# Patient Record
Sex: Female | Born: 1965
Health system: Southern US, Community
[De-identification: ages and names within clinical notes are randomized; demographics above are authoritative.]

## PROBLEM LIST (undated history)

## (undated) DIAGNOSIS — E041 Nontoxic single thyroid nodule: Secondary | ICD-10-CM

## (undated) DIAGNOSIS — D649 Anemia, unspecified: Secondary | ICD-10-CM

## (undated) DIAGNOSIS — R87619 Unspecified abnormal cytological findings in specimens from cervix uteri: Secondary | ICD-10-CM

## (undated) DIAGNOSIS — K602 Anal fissure, unspecified: Secondary | ICD-10-CM

## (undated) DIAGNOSIS — IMO0002 Reserved for concepts with insufficient information to code with codable children: Secondary | ICD-10-CM

## (undated) DIAGNOSIS — K219 Gastro-esophageal reflux disease without esophagitis: Secondary | ICD-10-CM

## (undated) DIAGNOSIS — K829 Disease of gallbladder, unspecified: Secondary | ICD-10-CM

## (undated) DIAGNOSIS — C439 Malignant melanoma of skin, unspecified: Secondary | ICD-10-CM

## (undated) DIAGNOSIS — Z5189 Encounter for other specified aftercare: Secondary | ICD-10-CM

## (undated) HISTORY — DX: Nontoxic single thyroid nodule: E04.1

## (undated) HISTORY — DX: Unspecified abnormal cytological findings in specimens from cervix uteri: R87.619

## (undated) HISTORY — DX: Anemia, unspecified: D64.9

## (undated) HISTORY — DX: Malignant melanoma of skin, unspecified: C43.9

## (undated) HISTORY — DX: Disease of gallbladder, unspecified: K82.9

## (undated) HISTORY — DX: Gastro-esophageal reflux disease without esophagitis: K21.9

## (undated) HISTORY — DX: Encounter for other specified aftercare: Z51.89

## (undated) HISTORY — DX: Reserved for concepts with insufficient information to code with codable children: IMO0002

## (undated) HISTORY — DX: Anal fissure, unspecified: K60.2

---
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
## 1990-03-25 HISTORY — PX: NOSE SURGERY: SHX723

## 1997-08-10 ENCOUNTER — Other Ambulatory Visit: Admission: RE | Admit: 1997-08-10 | Discharge: 1997-08-10 | Payer: Self-pay | Admitting: Obstetrics and Gynecology

## 1997-09-07 ENCOUNTER — Other Ambulatory Visit: Admission: RE | Admit: 1997-09-07 | Discharge: 1997-09-07 | Payer: Self-pay | Admitting: Obstetrics and Gynecology

## 1997-10-06 ENCOUNTER — Other Ambulatory Visit: Admission: RE | Admit: 1997-10-06 | Discharge: 1997-10-06 | Payer: Self-pay | Admitting: Obstetrics and Gynecology

## 1998-02-21 ENCOUNTER — Other Ambulatory Visit: Admission: RE | Admit: 1998-02-21 | Discharge: 1998-02-21 | Payer: Self-pay | Admitting: Obstetrics and Gynecology

## 1998-05-19 ENCOUNTER — Other Ambulatory Visit: Admission: RE | Admit: 1998-05-19 | Discharge: 1998-05-19 | Payer: Self-pay | Admitting: Obstetrics and Gynecology

## 1998-09-20 ENCOUNTER — Other Ambulatory Visit: Admission: RE | Admit: 1998-09-20 | Discharge: 1998-09-20 | Payer: Self-pay | Admitting: Obstetrics and Gynecology

## 1998-12-14 ENCOUNTER — Other Ambulatory Visit: Admission: RE | Admit: 1998-12-14 | Discharge: 1998-12-14 | Payer: Self-pay | Admitting: Obstetrics and Gynecology

## 1999-04-05 ENCOUNTER — Other Ambulatory Visit: Admission: RE | Admit: 1999-04-05 | Discharge: 1999-04-05 | Payer: Self-pay | Admitting: *Deleted

## 1999-07-23 ENCOUNTER — Other Ambulatory Visit: Admission: RE | Admit: 1999-07-23 | Discharge: 1999-07-23 | Payer: Self-pay | Admitting: Obstetrics and Gynecology

## 1999-09-03 ENCOUNTER — Encounter: Payer: Self-pay | Admitting: Internal Medicine

## 1999-09-03 ENCOUNTER — Ambulatory Visit (HOSPITAL_COMMUNITY): Admission: RE | Admit: 1999-09-03 | Discharge: 1999-09-03 | Payer: Self-pay | Admitting: Internal Medicine

## 1999-12-18 ENCOUNTER — Other Ambulatory Visit: Admission: RE | Admit: 1999-12-18 | Discharge: 1999-12-18 | Payer: Self-pay | Admitting: Obstetrics and Gynecology

## 2000-03-25 DIAGNOSIS — C439 Malignant melanoma of skin, unspecified: Secondary | ICD-10-CM

## 2000-03-25 HISTORY — DX: Malignant melanoma of skin, unspecified: C43.9

## 2001-01-20 ENCOUNTER — Other Ambulatory Visit: Admission: RE | Admit: 2001-01-20 | Discharge: 2001-01-20 | Payer: Self-pay | Admitting: Obstetrics and Gynecology

## 2002-02-25 ENCOUNTER — Other Ambulatory Visit: Admission: RE | Admit: 2002-02-25 | Discharge: 2002-02-25 | Payer: Self-pay | Admitting: Obstetrics and Gynecology

## 2003-04-28 ENCOUNTER — Other Ambulatory Visit: Admission: RE | Admit: 2003-04-28 | Discharge: 2003-04-28 | Payer: Self-pay | Admitting: Obstetrics and Gynecology

## 2004-07-19 ENCOUNTER — Encounter: Admission: RE | Admit: 2004-07-19 | Discharge: 2004-07-19 | Payer: Self-pay | Admitting: Allergy and Immunology

## 2007-10-08 ENCOUNTER — Encounter: Admission: RE | Admit: 2007-10-08 | Discharge: 2007-10-08 | Payer: Self-pay | Admitting: Gynecology

## 2007-10-29 ENCOUNTER — Ambulatory Visit (HOSPITAL_COMMUNITY): Admission: RE | Admit: 2007-10-29 | Discharge: 2007-10-29 | Payer: Self-pay | Admitting: Gynecology

## 2007-11-05 ENCOUNTER — Encounter: Admission: RE | Admit: 2007-11-05 | Discharge: 2007-11-05 | Payer: Self-pay | Admitting: General Surgery

## 2007-11-05 ENCOUNTER — Other Ambulatory Visit: Admission: RE | Admit: 2007-11-05 | Discharge: 2007-11-05 | Payer: Self-pay | Admitting: Diagnostic Radiology

## 2007-11-05 ENCOUNTER — Encounter (INDEPENDENT_AMBULATORY_CARE_PROVIDER_SITE_OTHER): Payer: Self-pay | Admitting: Diagnostic Radiology

## 2008-03-25 DIAGNOSIS — K829 Disease of gallbladder, unspecified: Secondary | ICD-10-CM

## 2008-03-25 HISTORY — DX: Disease of gallbladder, unspecified: K82.9

## 2008-03-25 HISTORY — PX: GALLBLADDER SURGERY: SHX652

## 2008-05-11 ENCOUNTER — Encounter: Admission: RE | Admit: 2008-05-11 | Discharge: 2008-05-11 | Payer: Self-pay | Admitting: General Surgery

## 2008-09-01 LAB — CONVERTED CEMR LAB: Pap Smear: NORMAL

## 2008-10-11 ENCOUNTER — Ambulatory Visit: Payer: Self-pay | Admitting: Internal Medicine

## 2008-10-11 DIAGNOSIS — M545 Low back pain, unspecified: Secondary | ICD-10-CM | POA: Insufficient documentation

## 2008-10-11 DIAGNOSIS — R1031 Right lower quadrant pain: Secondary | ICD-10-CM | POA: Insufficient documentation

## 2008-10-11 DIAGNOSIS — F4325 Adjustment disorder with mixed disturbance of emotions and conduct: Secondary | ICD-10-CM | POA: Insufficient documentation

## 2008-10-11 DIAGNOSIS — Z85828 Personal history of other malignant neoplasm of skin: Secondary | ICD-10-CM | POA: Insufficient documentation

## 2008-10-11 DIAGNOSIS — K8020 Calculus of gallbladder without cholecystitis without obstruction: Secondary | ICD-10-CM | POA: Insufficient documentation

## 2008-10-11 LAB — CONVERTED CEMR LAB
AST: 16 units/L (ref 0–37)
BUN: 13 mg/dL (ref 6–23)
Basophils Absolute: 0 10*3/uL (ref 0.0–0.1)
Basophils Relative: 0.9 % (ref 0.0–3.0)
CO2: 27 meq/L (ref 19–32)
Chloride: 111 meq/L (ref 96–112)
Eosinophils Absolute: 0 10*3/uL (ref 0.0–0.7)
HCT: 37.1 % (ref 36.0–46.0)
Hemoglobin: 12.7 g/dL (ref 12.0–15.0)
Lymphs Abs: 1.4 10*3/uL (ref 0.7–4.0)
MCHC: 34.2 g/dL (ref 30.0–36.0)
MCV: 91.2 fL (ref 78.0–100.0)
Platelets: 243 10*3/uL (ref 150.0–400.0)
Potassium: 4.3 meq/L (ref 3.5–5.1)
RDW: 11.2 % — ABNORMAL LOW (ref 11.5–14.6)
Sodium: 141 meq/L (ref 135–145)
Total Protein, Urine: NEGATIVE mg/dL
Urine Glucose: NEGATIVE mg/dL
Urobilinogen, UA: 0.2 (ref 0.0–1.0)
pH: 5.5 (ref 5.0–8.0)

## 2008-10-12 ENCOUNTER — Encounter: Payer: Self-pay | Admitting: Internal Medicine

## 2008-10-14 ENCOUNTER — Ambulatory Visit: Payer: Self-pay | Admitting: Internal Medicine

## 2008-10-24 ENCOUNTER — Ambulatory Visit: Payer: Self-pay | Admitting: Internal Medicine

## 2008-11-10 ENCOUNTER — Encounter: Payer: Self-pay | Admitting: Internal Medicine

## 2008-11-18 ENCOUNTER — Ambulatory Visit (HOSPITAL_COMMUNITY): Admission: RE | Admit: 2008-11-18 | Discharge: 2008-11-18 | Payer: Self-pay | Admitting: General Surgery

## 2008-11-18 ENCOUNTER — Encounter (INDEPENDENT_AMBULATORY_CARE_PROVIDER_SITE_OTHER): Payer: Self-pay | Admitting: General Surgery

## 2008-12-09 ENCOUNTER — Encounter: Payer: Self-pay | Admitting: Internal Medicine

## 2009-05-12 ENCOUNTER — Ambulatory Visit (HOSPITAL_COMMUNITY): Admission: RE | Admit: 2009-05-12 | Discharge: 2009-05-12 | Payer: Self-pay | Admitting: Obstetrics and Gynecology

## 2009-06-02 ENCOUNTER — Ambulatory Visit (HOSPITAL_COMMUNITY): Admission: RE | Admit: 2009-06-02 | Discharge: 2009-06-02 | Payer: Self-pay | Admitting: Obstetrics and Gynecology

## 2009-08-08 ENCOUNTER — Ambulatory Visit: Payer: Self-pay | Admitting: Obstetrics and Gynecology

## 2009-08-15 ENCOUNTER — Ambulatory Visit: Payer: Self-pay | Admitting: Obstetrics & Gynecology

## 2009-08-22 ENCOUNTER — Ambulatory Visit: Payer: Self-pay | Admitting: Obstetrics & Gynecology

## 2009-08-29 ENCOUNTER — Inpatient Hospital Stay (HOSPITAL_COMMUNITY): Admission: AD | Admit: 2009-08-29 | Discharge: 2009-09-02 | Payer: Self-pay | Admitting: Obstetrics and Gynecology

## 2009-08-30 ENCOUNTER — Encounter (INDEPENDENT_AMBULATORY_CARE_PROVIDER_SITE_OTHER): Payer: Self-pay | Admitting: Obstetrics and Gynecology

## 2009-08-31 ENCOUNTER — Encounter: Admission: RE | Admit: 2009-08-31 | Discharge: 2009-09-30 | Payer: Self-pay | Admitting: Obstetrics and Gynecology

## 2009-10-01 ENCOUNTER — Encounter: Admission: RE | Admit: 2009-10-01 | Discharge: 2009-10-31 | Payer: Self-pay | Admitting: Obstetrics and Gynecology

## 2009-11-01 ENCOUNTER — Encounter: Admission: RE | Admit: 2009-11-01 | Discharge: 2009-12-01 | Payer: Self-pay | Admitting: Obstetrics and Gynecology

## 2009-12-02 ENCOUNTER — Encounter: Admission: RE | Admit: 2009-12-02 | Discharge: 2009-12-13 | Payer: Self-pay | Admitting: Obstetrics and Gynecology

## 2010-01-01 IMAGING — CT CT PELVIS W/ CM
2 of 5 series · 16 of 46 positions shown, 18 images · IV contrast (Omnipaque 300)
Comparison: None.

CT ABDOMEN

CLINICAL DATA: Right lower quadrant abdominal pain, constipation,
previous appendectomy

CT ABDOMEN AND PELVIS WITH CONTRAST
TECHNIQUE: Multidetector CT imaging of the abdomen and pelvis was
performed using the standard protocol following bolus
administration of intravenous contrast.
Contrast: 100 ml Kmnipaque-3BB

[Series 2: abd/ pel · axial · 0.73mm/px · z∈[-540,-130]mm · 13 of 92 slices shown, 15 images]
[im 5/92  soft-tissue]
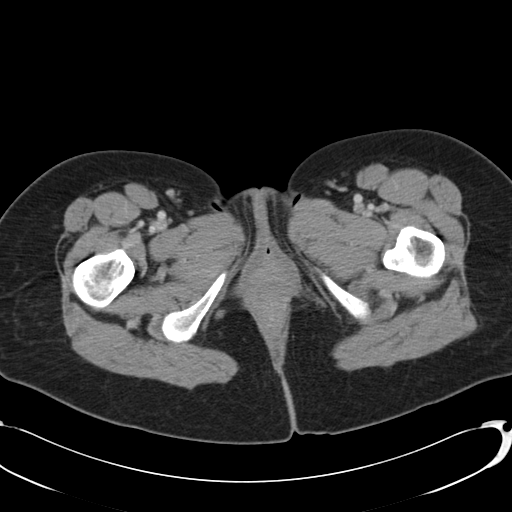
[im 5/92  bone]
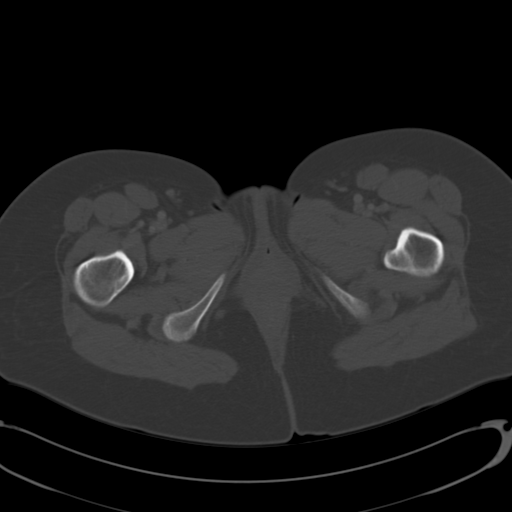
[im 13/92  soft-tissue]
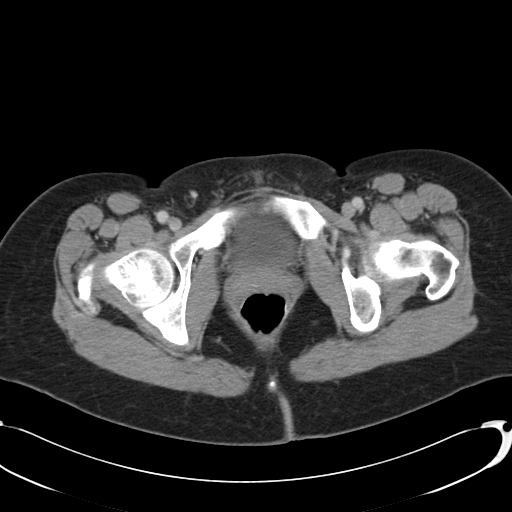
[im 21/92  soft-tissue]
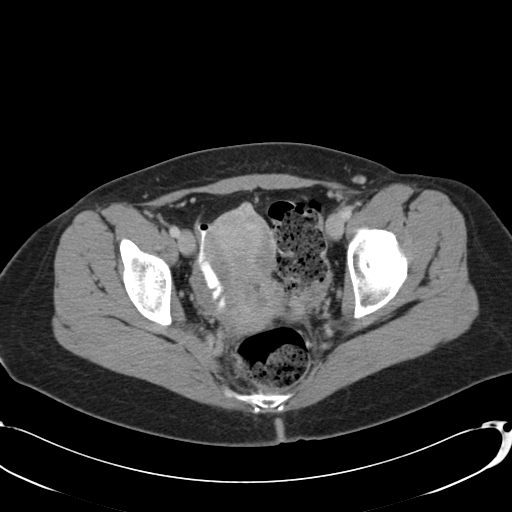
[im 25/92  soft-tissue]
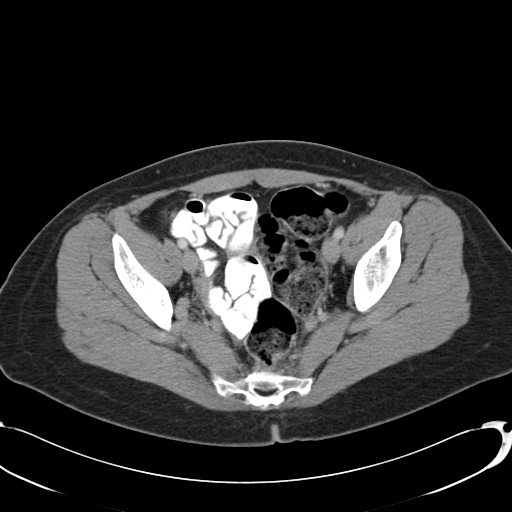
[im 34/92  soft-tissue]
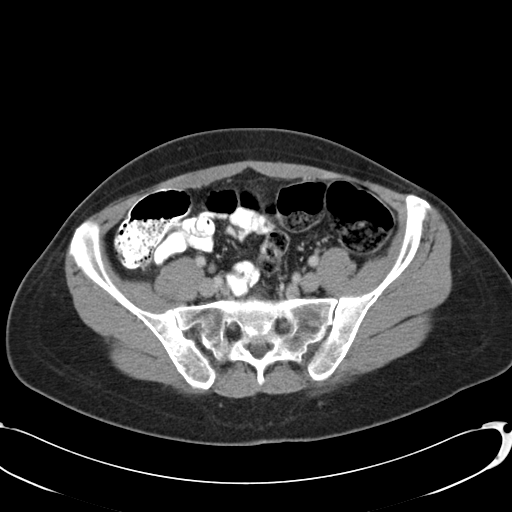
[im 38/92  soft-tissue]
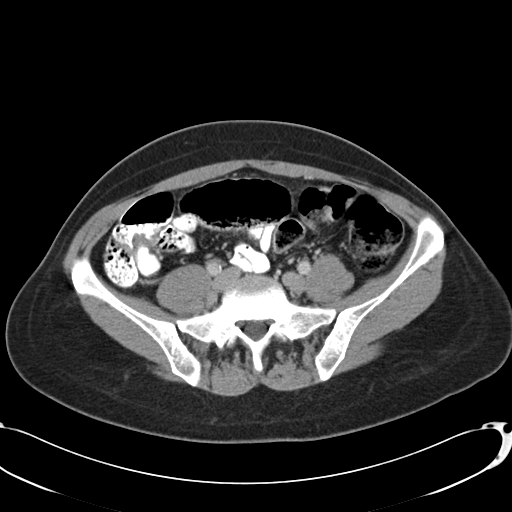
[im 46/92  soft-tissue]
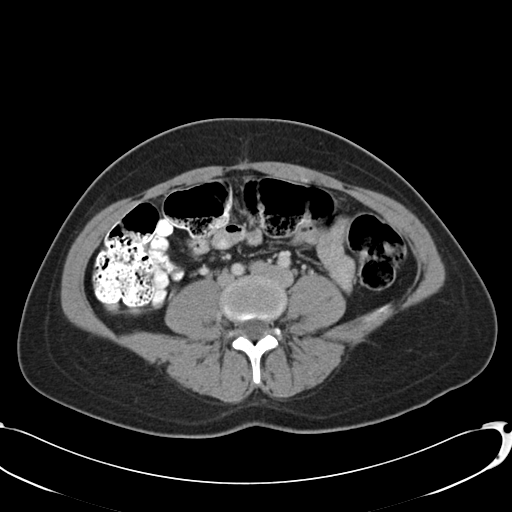
[im 54/92  soft-tissue]
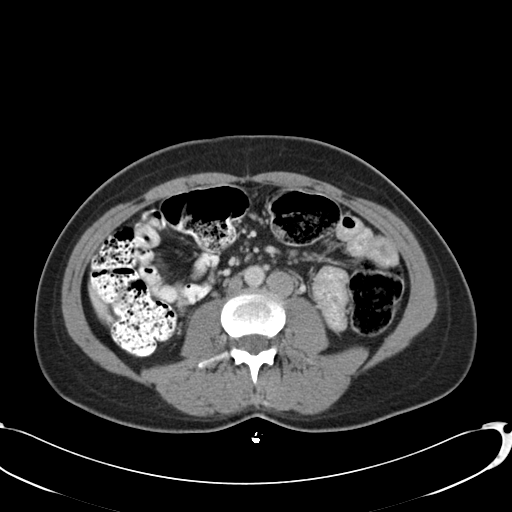
[im 58/92  soft-tissue]
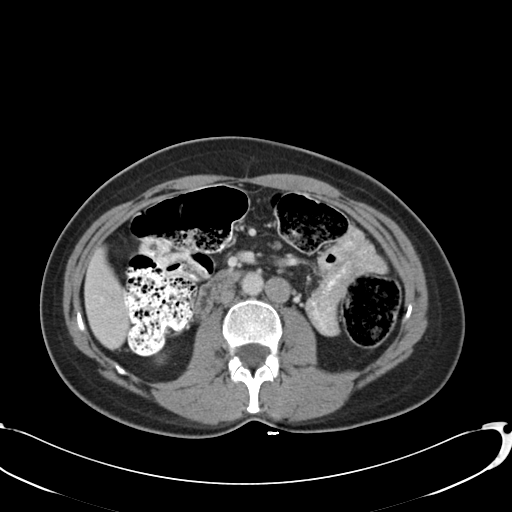
[im 58/92  bone]
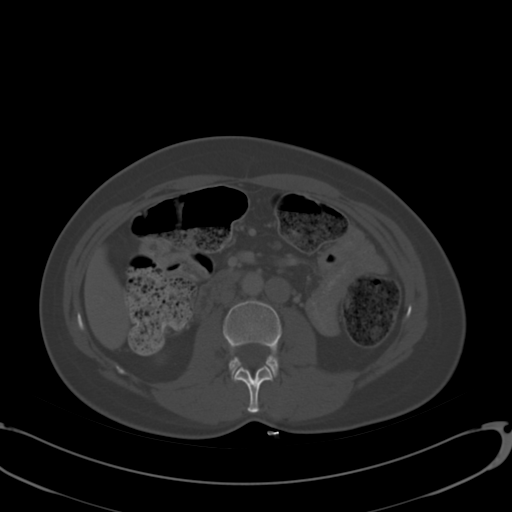
[im 67/92  soft-tissue]
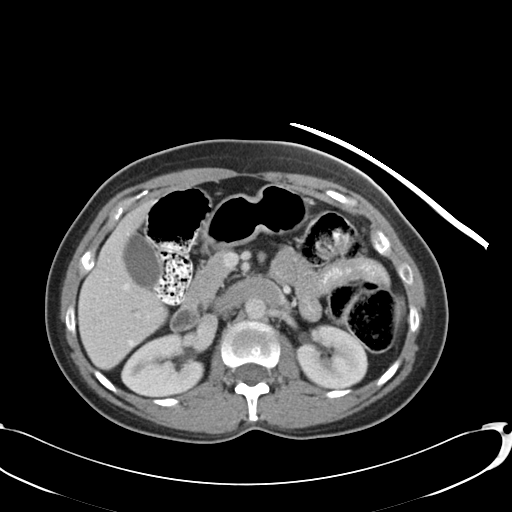
[im 71/92  soft-tissue]
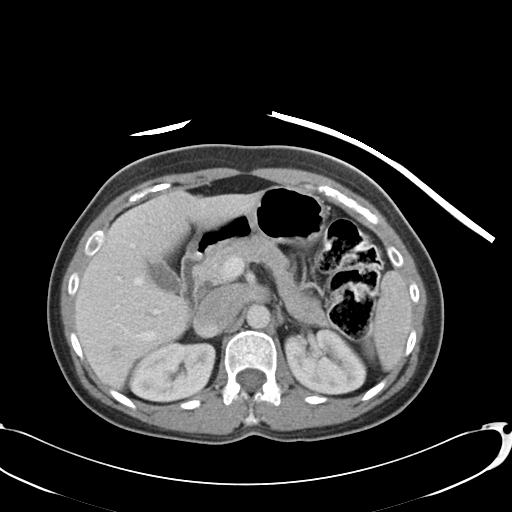
[im 79/92  soft-tissue]
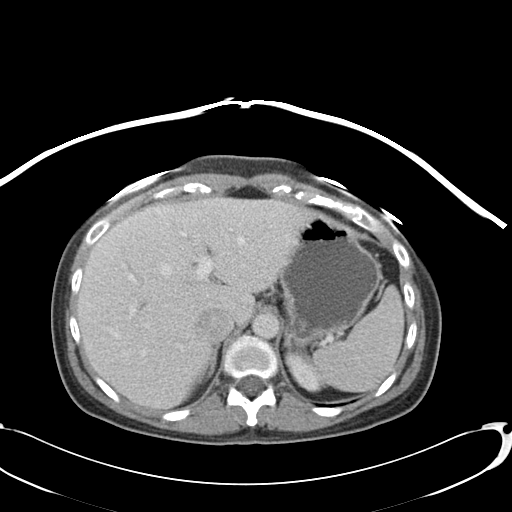
[im 87/92  soft-tissue]
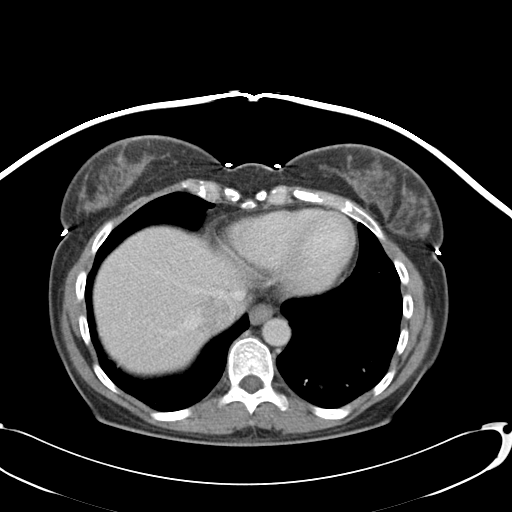

[Series 602: <mpr range> · coronal · 0.93mm/px · 3 of 104 slices shown]
[im 35/104  soft-tissue]
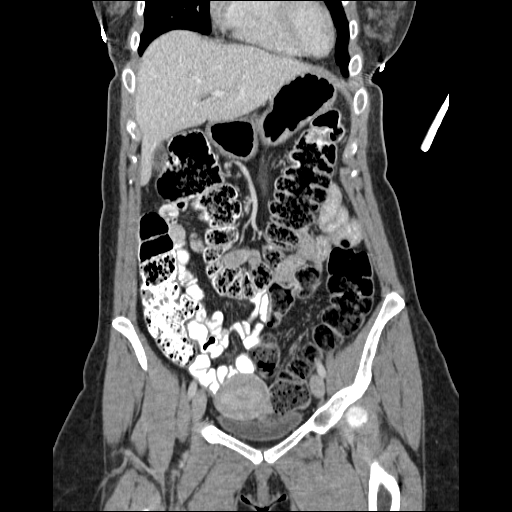
[im 46/104  soft-tissue]
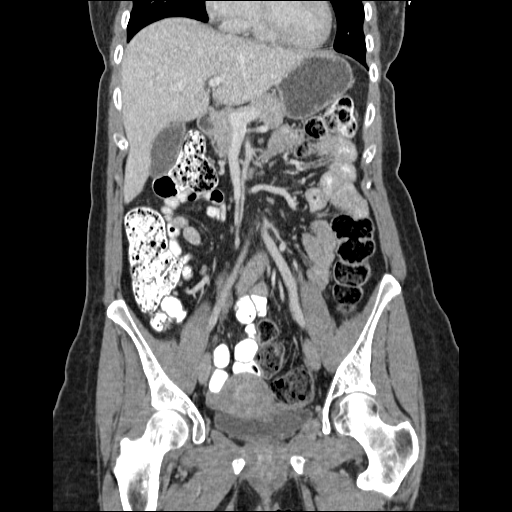
[im 58/104  soft-tissue]
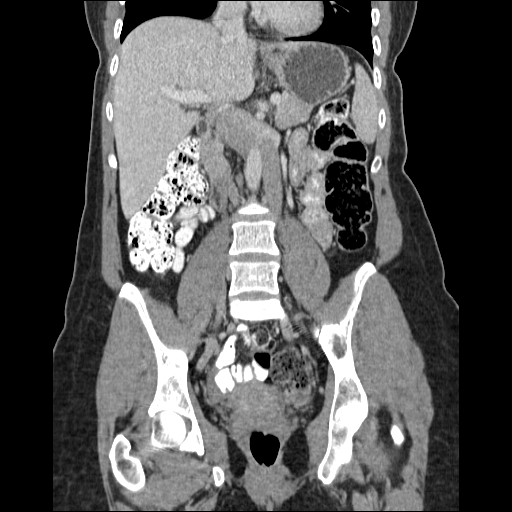

[16 of 46 positions shown; findings below may reference images not displayed]

FINDINGS: Lung bases are clear.  Normal heart size.  No
pericardial or pleural effusion.  No hiatal hernia.

Liver demonstrates focal low attenuation along the falciform
ligament, image 20 suspicious for subcapsular focal fatty
infiltration.  Hepatic and portal veins are patent.  No biliary
obstruction or dilatation.  Nonspecific ill-defined low attenuation
12 mm area noted in the right hepatic lobe posterior segment, image
23, too small to definitively characterize.

The gallbladder, biliary system, pancreas, adrenal glands, spleen,
and kidneys are within normal limits for age.  IVC is dilated at
the level of the renal veins.  Infrarenal IVC duplication is noted,
this is a normal IVC variant.  Colon is distended with air,
contrast and stool.  No evidence of bowel obstruction, ileus
pattern or free air.  No bowel wall thickening present.  No
abdominal acute inflammatory process, free fluid, fluid collection,
or abscess.
IMPRESSION: No acute intra-abdominal finding.
Infrarenal IVC duplication
Focal fatty infiltration of the liver
Posterior right hepatic lobe ill-defined 12 mm hypodensity, too
small to definitively characterize, suspect volume averaging of a
small hepatic cyst versus an area of fatty infiltration. Consider
ultrasound correlation.

CT PELVIS
FINDINGS: Distal colon is also is stool-filled.  No pelvic fluid
collection, abscess, hemorrhage, free fluid adenopathy or inguinal
hernia.  Urinary bladder is unremarkable.  Uterus demonstrates a
lower uterine segment/cervical sub centimeter low density, image 73
suspicious for small cervical Nabothian cyst. Also, the uterus
demonstrates an anterior fundal left sided nodular lesion overlying
the bladder measuring 2.5 cm.  Suspect small uterine fibroid.
IMPRESSION: Retained stool in the colon distally.
No acute intrapelvic finding.

## 2010-01-02 ENCOUNTER — Encounter: Admission: RE | Admit: 2010-01-02 | Discharge: 2010-02-01 | Payer: Self-pay | Admitting: Obstetrics and Gynecology

## 2010-02-02 ENCOUNTER — Encounter
Admission: RE | Admit: 2010-02-02 | Discharge: 2010-03-04 | Payer: Self-pay | Source: Home / Self Care | Attending: Obstetrics and Gynecology | Admitting: Obstetrics and Gynecology

## 2010-03-05 ENCOUNTER — Encounter
Admission: RE | Admit: 2010-03-05 | Discharge: 2010-03-22 | Payer: Self-pay | Source: Home / Self Care | Attending: Obstetrics and Gynecology | Admitting: Obstetrics and Gynecology

## 2010-04-16 ENCOUNTER — Encounter: Payer: Self-pay | Admitting: Gynecology

## 2010-05-22 ENCOUNTER — Other Ambulatory Visit: Payer: Self-pay | Admitting: Dermatology

## 2010-06-11 LAB — CBC
HCT: 18.3 % — ABNORMAL LOW (ref 36.0–46.0)
HCT: 18.9 % — ABNORMAL LOW (ref 36.0–46.0)
HCT: 22 % — ABNORMAL LOW (ref 36.0–46.0)
Hemoglobin: 10.8 g/dL — ABNORMAL LOW (ref 12.0–15.0)
Hemoglobin: 5.7 g/dL — CL (ref 12.0–15.0)
Hemoglobin: 6.2 g/dL — CL (ref 12.0–15.0)
Hemoglobin: 6.5 g/dL — CL (ref 12.0–15.0)
Hemoglobin: 7.7 g/dL — ABNORMAL LOW (ref 12.0–15.0)
MCHC: 33.9 g/dL (ref 30.0–36.0)
MCHC: 33.9 g/dL (ref 30.0–36.0)
MCHC: 34.5 g/dL (ref 30.0–36.0)
MCHC: 34.6 g/dL (ref 30.0–36.0)
MCHC: 34.8 g/dL (ref 30.0–36.0)
MCV: 85.9 fL (ref 78.0–100.0)
MCV: 86.1 fL (ref 78.0–100.0)
MCV: 86.7 fL (ref 78.0–100.0)
MCV: 86.8 fL (ref 78.0–100.0)
Platelets: 242 K/uL (ref 150–400)
Platelets: 248 K/uL (ref 150–400)
Platelets: 288 K/uL (ref 150–400)
RBC: 2.1 MIL/uL — ABNORMAL LOW (ref 3.87–5.11)
RBC: 2.2 MIL/uL — ABNORMAL LOW (ref 3.87–5.11)
RBC: 2.54 MIL/uL — ABNORMAL LOW (ref 3.87–5.11)
RBC: 3.72 MIL/uL — ABNORMAL LOW (ref 3.87–5.11)
RDW: 12.7 % (ref 11.5–15.5)
RDW: 13.4 % (ref 11.5–15.5)
RDW: 13.5 % (ref 11.5–15.5)
RDW: 13.9 % (ref 11.5–15.5)
WBC: 10.6 10*3/uL — ABNORMAL HIGH (ref 4.0–10.5)
WBC: 11.8 K/uL — ABNORMAL HIGH (ref 4.0–10.5)
WBC: 19.9 K/uL — ABNORMAL HIGH (ref 4.0–10.5)
WBC: 9.4 K/uL (ref 4.0–10.5)

## 2010-06-11 LAB — CROSSMATCH
ABO/RH(D): A POS
Antibody Screen: NEGATIVE

## 2010-06-30 LAB — COMPREHENSIVE METABOLIC PANEL
ALT: 13 U/L (ref 0–35)
AST: 19 U/L (ref 0–37)
Alkaline Phosphatase: 46 U/L (ref 39–117)
CO2: 27 mEq/L (ref 19–32)
GFR calc Af Amer: >60 mL/min (ref >60)
Glucose, Bld: 91 mg/dL (ref 70–99)
Potassium: 4.6 mEq/L (ref 3.5–5.1)
Sodium: 139 mEq/L (ref 135–145)
Total Protein: 7.3 g/dL (ref 6.0–8.3)

## 2010-06-30 LAB — DIFFERENTIAL
Basophils Relative: 0 % (ref 0–1)
Lymphocytes Relative: 38 % (ref 12–46)
Monocytes Absolute: 0.4 10*3/uL (ref 0.1–1.0)
Monocytes Relative: 8 % (ref 3–12)
Neutrophils Relative %: 52 % (ref 43–77)

## 2010-08-07 NOTE — Op Note (Signed)
[name], [name]             ACCOUNT NO.:  [id number]   MEDICAL RECORD NO.:  [id number]          PATIENT TYPE:  AMB   LOCATION:  SDS                          FACILITY:  [facility]   PHYSICIAN:  Ollen Gross. Vernell Morgans, M.D. DATE OF BIRTH:  10/15/1965   DATE OF PROCEDURE:  11/18/2008  DATE OF DISCHARGE:  11/18/2008                               OPERATIVE REPORT   PREOPERATIVE DIAGNOSIS:  Gallstones.   POSTOPERATIVE DIAGNOSIS:  Gallstones.   PROCEDURE:  Laparoscopic cholecystectomy with intraoperative  cholangiogram.   SURGEON:  Ollen Gross. Vernell Morgans, MD   ANESTHESIA:  General endotracheal.   PROCEDURE:  After informed consent was obtained, the patient was brought  to the operating room, placed in supine position on the operating table.  After induction of general anesthesia, the patient's abdomen was prepped  with Betadine and draped in usual sterile manner.  The area below the  umbilicus was infiltrated with 0.25% Marcaine.  A small incision was  made with a #15 blade knife.  This incision was carried down through the  subcutaneous tissue bluntly with a hemostat and [employer] retractors  until the linea alba was identified.  The linea alba was incised with a  #15 blade knife and each side was grasped with Kocher clamps and  elevated anteriorly.  The preperitoneal space was then probed bluntly  with a hemostat until the peritoneum was opened and access was gained to  the abdominal cavity.  The 0 Vicryl pursestring stitch was placed in the  fascia.  Around the opening, Hasson cannula was placed through the  opening and anchored in place with a previously placed Vicryl  pursestring stitch.  The abdomen was then insufflated with carbon  dioxide without difficulty.  A laparoscope was inserted through the  Hasson cannula and the right upper quadrant was inspected.  The dome of  the gallbladder and liver were readily identified.  Next, the epigastric  region was infiltrated with 0.25% Marcaine.   A small incision was made  with #15 blade knife and a 10-mm port was placed bluntly through this  incision into the abdominal cavity under direct vision.  Sites were then  chosen on the right side of the abdomen for placement of 5-mm ports.  Each of these area was infiltrated with 0.25% Marcaine.  Small stab  incisions were made with # 15 blade knife.  5-mm ports were placed  bluntly through these incisions into the abdominal cavity under direct  vision.  Blunt grasper was placed through the lateral-most 5-mm port and  used to grasp the dome of the gallbladder and elevated anteriorly and  superiorly.  Another blunt grasper was placed through the other 5-mm  port and used to retract on the body and neck of the gallbladder.  A  dissector was placed through the epigastric port and using  electrocautery, the peritoneal reflection at the gallbladder neck was  opened.  Blunt dissection was then carried out in this area until the  gallbladder neck and cystic duct junction was readily identified and a  good window was created.  A single clip was placed on  the gallbladder  neck.  A small ductotomy was made just below the clip.  A 14-gauge  Angiocath was placed percutaneously through the anterior abdominal wall  under direct vision.  A Reddick cholangiogram catheter was placed  through the Angiocath and flushed.  The Reddick catheter was then placed  within the cystic duct and anchored in place with a clip.  Cholangiogram  was obtained, that showed no filling defects, good emptying in duodenum,  and adequate length on the cystic duct.  The anchoring clip and  catheters were then removed from the patient.  Three clips were placed  proximally on the cystic duct and duct was divided between the two sets  of clips.  Posterior to this, the cystic artery was identified and again  dissected bluntly in circumferential manner until a good window was  created.  Two clips were placed proximally and one  distally on the  artery, and the artery was divided between the 22.  Next, a laparoscopic  hook cautery device was used to separate the gallbladder from the liver  bed.  Prior to completely detaching the gallbladder from liver bed, the  liver bed was inspected.  Several small bleeding points were coagulated  with the electrocautery until they were all completely hemostatic.  The  gallbladder was then detached and wrested away from the liver bed  without difficulty.  A laparoscopic bag was inserted through the Hasson  cannula, gallbladder was placed in the bag, and the bag was sealed.  The  abdomen was then irrigated with copious amounts of saline until the  effluent was clear.  The liver bed was inspected again and found to be  hemostatic.  The patient on further inspection did appear to have what  looked like some endometriosis along the right colonic gutter, but this  was couple of several small spots.  At this point, the laparoscope was  then moved to the epigastric port.  The laparoscopic gallbladder grasper  was placed through the Hasson cannula and used to grasp the opening of  the bag.  The bag with the gallbladder was removed through the  infraumbilical port without difficulty.  The fascial defect was closed  with previously placed Vicryl purse-string stitch as well as with  another figure-of-eight 0 Vicryl stitch.  The rest of the ports were  removed under direct vision and were found to be hemostatic.  The gas  was allowed to escape.  The skin incisions were all closed with  interrupted 4-0 Monocryl subcuticular stitches.  Then Dermabond  dressings were applied.  The patient tolerated the procedure well.  At  the end of the case, all needle, sponge, and instrument counts were  correct.  The patient was then awakened and taken to recovery room in  stable condition.      Ollen Gross. Vernell Morgans, M.D.  Electronically Signed     PST/MEDQ  D:  11/18/2008  T:  11/19/2008  Job:   [id number]

## 2010-11-27 ENCOUNTER — Other Ambulatory Visit: Payer: Self-pay | Admitting: Dermatology

## 2011-03-08 ENCOUNTER — Other Ambulatory Visit: Payer: Self-pay | Admitting: Dermatology

## 2011-12-16 ENCOUNTER — Other Ambulatory Visit: Payer: Self-pay | Admitting: Obstetrics and Gynecology

## 2011-12-17 ENCOUNTER — Encounter: Payer: Self-pay | Admitting: Gastroenterology

## 2012-01-17 ENCOUNTER — Encounter: Payer: Self-pay | Admitting: Gastroenterology

## 2012-01-17 ENCOUNTER — Ambulatory Visit (INDEPENDENT_AMBULATORY_CARE_PROVIDER_SITE_OTHER): Payer: 59 | Admitting: Gastroenterology

## 2012-01-17 VITALS — BP 122/82 | HR 76 | Ht 66.25 in | Wt 146.5 lb

## 2012-01-17 DIAGNOSIS — K219 Gastro-esophageal reflux disease without esophagitis: Secondary | ICD-10-CM

## 2012-01-17 MED ORDER — OMEPRAZOLE 20 MG PO CPDR: DELAYED_RELEASE_CAPSULE | ORAL | Status: DC

## 2012-01-17 NOTE — Progress Notes (Signed)
HPI: This is a   very pleasant 46 year old woman whom I am meeting for the first time today.  WAs at [facility]:  Had EGD Oct 2012 with Dr. Earlene Plater: was normal.  She was told to stay on bid PPI.  On this regimen her symptoms are usually improves.  Usually symptoms are usually left chest pain, radiating down her arm.  She would have these pains EVERY day if she took no PPI.  1/2 hour before bf, 1/2 before dinner.  She estimates 80% of the time.  If she has breakthrough it is usually after dinner..  She knows chocolate exacerbates, garlic exacerbates.  Non smoker, drinks 12 oz coffee in AM, drinks 6-8 beers per week.   Used to chew a lot peppermint.  Overall stable weight.  2 daughters, needed a donor egg for IVF.  No dysphagia.  Review of systems: Pertinent positive and negative review of systems were noted in the above HPI section. Complete review of systems was performed and was otherwise normal.    Past Medical History  Diagnosis Date  . Abnormal pap   . Anemia   . Thyroid nodule   . Gallbladder disease 2010  . Anal fissure   . Melanoma 2002    Past Surgical History  Procedure Date  . Adenoidectomy 1980  . Appendectomy 1984  . Nose surgery 1992  . Gallbladder surgery 2010    Current Outpatient Prescriptions  Medication Sig Dispense Refill  . desogestrel-ethinyl estradiol (AZURETTE) 0.15-0.02/0.01 MG (21/5) tablet Take 1 tablet by mouth daily.      Marland Kitchen omeprazole (PRILOSEC) 20 MG capsule Take 2 capsules by mouth in the morning and 1 in the eavening        Allergies as of 01/17/2012 - Review Complete 01/17/2012  Allergen Reaction Noted  . Sulfonamide derivatives      Family History  Problem Relation Age of Onset  . Heart disease Maternal Grandmother   . Hypothyroidism Mother   . Gallbladder disease Sister     #1  . Nephrolithiasis Father   . Diverticulosis Mother   . Hypertension Mother   . Heart disease Father   . Gallbladder disease Sister     #2  .  Diverticulosis Maternal Grandfather   . Hypertension Father   . Hypertension Sister     x 2    History   Social History  . Marital Status: Married    Spouse Name: N/A    Number of Children: 2  . 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 Smoking status: Never Smoker   . Smokeless tobacco: Never Used  . Alcohol Use: Yes     2 per day  . Drug Use: No  . Sexually Active: Not on file   Other Topics Concern  . Not on file   Social History Narrative  . No narrative on file       Physical Exam: BP 122/82  Pulse 76  Ht 5' 6.25" (1.683 m)  Wt 146 lb 8 oz (66.452 kg)  BMI 23.47 kg/m2  LMP 01/14/2012 Constitutional: generally well-appearing Psychiatric: alert and oriented x3 Eyes: extraocular movements intact Mouth: oral pharynx moist, no lesions Neck: supple no lymphadenopathy Cardiovascular: heart regular rate and rhythm Lungs: clear to auscultation bilaterally Abdomen: soft, nontender, nondistended, no obvious ascites, no peritoneal signs, normal bowel sounds Extremities: no lower extremity edema bilaterally Skin: no lesions on visible extremities  Assessment and plan: 46 y.o. female with  chronic GERD without alarm symptoms  Her GERD symptoms are under very good control twice-daily proton pump inhibitor. She needs to take TUMS about 2-3 times per month. She has no alarm symptoms. I don't think she requires repeat endoscopy this point and she should continue on her proton pump inhibitor twice daily. She does get in contact with me she has any further questions or concerns.

## 2012-01-17 NOTE — Patient Instructions (Addendum)
You have GERD, acid reflux issues.  Stay on prilosec twice daily for now. Cutting back on your 1 beer a night may help your GERD. You need colon cancer screening (colonoscopy) at age 46. Call if you have any further questions or concerns. A copy of this information will be made available to [facility].

## 2012-09-07 ENCOUNTER — Other Ambulatory Visit: Payer: Self-pay | Admitting: Dermatology

## 2012-12-28 ENCOUNTER — Other Ambulatory Visit: Payer: Self-pay | Admitting: Obstetrics and Gynecology

## 2013-05-05 ENCOUNTER — Ambulatory Visit: Payer: 59 | Admitting: Gastroenterology

## 2013-05-18 ENCOUNTER — Ambulatory Visit (INDEPENDENT_AMBULATORY_CARE_PROVIDER_SITE_OTHER): Payer: 59 | Admitting: Gastroenterology

## 2013-05-18 ENCOUNTER — Encounter: Payer: Self-pay | Admitting: Gastroenterology

## 2013-05-18 VITALS — BP 120/80 | HR 76 | Ht 66.0 in | Wt 160.0 lb

## 2013-05-18 DIAGNOSIS — K219 Gastro-esophageal reflux disease without esophagitis: Secondary | ICD-10-CM

## 2013-05-18 MED ORDER — LANSOPRAZOLE 30 MG PO TBDP: 30.0000 mg | ORAL_TABLET | Freq: Every day | ORAL | Status: DC

## 2013-05-18 NOTE — Patient Instructions (Signed)
New prescription call in for prevacid.  Take one pill 20-30 min before breakfast meal daily. Will refill this at one year if needed. Would like return visit in 2 years if you still require prescription PPI.

## 2013-05-18 NOTE — Progress Notes (Signed)
Review of pertinent gastrointestinal problems: 1. GERD: [facility]: Had EGD Oct 2012 with Dr. Rosana Hoes: was normal.  2013 transferred care to Dr. Ardis Hughs: BID PPI was working for her GERD symptoms (no alarm symptoms)   HPI: This is a  very pleasant 48 year old woman whom I last saw about a year and a half ago.  She changed to prevacid OTC late summer 2014.  Takes 2 pills once daily.  In AM, 1/2 hour before breakfast.  This works very well for her for her reflux symptoms which are chest pain.  1 cup coffee daily.  Her symptoms of pyrosis are left sided chest pressure.  Improved with belching.  No dysphagia.  Overall her weigh is up 7-8 pounds in past year.  Three hours after eating,   Past Medical History  Diagnosis Date  . Abnormal pap   . Anemia   . Thyroid nodule   . Gallbladder disease 2010  . Anal fissure   . Melanoma 2002    Past Surgical History  Procedure Laterality Date  . Adenoidectomy  1980  . Appendectomy  1984  . Nose surgery  1992  . Gallbladder surgery  2010    Current Outpatient Prescriptions  Medication Sig Dispense Refill  . desogestrel-ethinyl estradiol (AZURETTE) 0.15-0.02/0.01 MG (21/5) tablet Take 1 tablet by mouth daily.      . lansoprazole (PREVACID) 15 MG capsule Take 15 mg by mouth 2 (two) times daily before a meal.       No current facility-administered medications for this visit.    Allergies as of 05/18/2013 - Review Complete 05/18/2013  Allergen Reaction Noted  . Sulfonamide derivatives      Family History  Problem Relation Age of Onset  . Heart disease Maternal Grandmother   . Hypothyroidism Mother   . Gallbladder disease Sister     #1  . Nephrolithiasis Father   . Diverticulosis Mother   . Hypertension Mother   . Heart disease Father   . Gallbladder disease Sister     #2  . Diverticulosis Maternal Grandfather   . Hypertension Father   . Hypertension Sister     x 2    History   Social History  . Marital Status:  Married    Spouse Name: N/A    Number of Children: 2  . 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 Smoking status: Never Smoker   . Smokeless tobacco: Never Used  . Alcohol Use: Yes     Comment: 2 per day  . Drug Use: No  . Sexual Activity: Not on file   Other Topics Concern  . Not on file   Social History Narrative  . No narrative on file      Physical Exam: BP 120/80  Pulse 76  Ht 5\' 6"  (1.676 m)  Wt 160 lb (72.576 kg)  BMI 25.84 kg/m2 Constitutional: generally well-appearing Psychiatric: alert and oriented x3 Abdomen: soft, nontender, nondistended, no obvious ascites, no peritoneal signs, normal bowel sounds     Assessment and plan: 48 y.o. female with atypical reflux symptoms but they aren't reliably improved with proton pump inhibitor, no alarm symptoms  She'll continue her proton pump inhibitor once daily. I called her in a prescription for Prevacid 30 mg. I'm happy to call this in again in one year if needed. I would like to see me in the office in about 2 years if she still requires prescription  proton pump inhibitor.

## 2013-12-29 ENCOUNTER — Other Ambulatory Visit: Payer: Self-pay | Admitting: Gynecology

## 2013-12-30 LAB — CYTOLOGY - PAP

## 2014-01-06 ENCOUNTER — Other Ambulatory Visit: Payer: Self-pay

## 2014-02-09 ENCOUNTER — Other Ambulatory Visit: Payer: Self-pay | Admitting: Gynecology

## 2014-08-24 ENCOUNTER — Other Ambulatory Visit: Payer: Self-pay | Admitting: Gynecology

## 2014-08-25 LAB — CYTOLOGY - PAP

## 2014-11-04 ENCOUNTER — Ambulatory Visit: Payer: Self-pay | Admitting: Internal Medicine

## 2014-11-18 ENCOUNTER — Other Ambulatory Visit (INDEPENDENT_AMBULATORY_CARE_PROVIDER_SITE_OTHER): Payer: 59

## 2014-11-18 ENCOUNTER — Encounter: Payer: Self-pay | Admitting: Internal Medicine

## 2014-11-18 ENCOUNTER — Ambulatory Visit (INDEPENDENT_AMBULATORY_CARE_PROVIDER_SITE_OTHER): Payer: 59 | Admitting: Internal Medicine

## 2014-11-18 VITALS — BP 120/78 | HR 89 | Temp 98.0°F | Resp 14 | Ht 66.0 in | Wt 160.4 lb

## 2014-11-18 DIAGNOSIS — R1031 Right lower quadrant pain: Secondary | ICD-10-CM | POA: Diagnosis not present

## 2014-11-18 DIAGNOSIS — Z0000 Encounter for general adult medical examination without abnormal findings: Secondary | ICD-10-CM

## 2014-11-18 LAB — LIPID PANEL
CHOLESTEROL: 201 mg/dL — AB (ref 0–200)
HDL: 57.6 mg/dL (ref >39.00)
LDL CALC: 126 mg/dL — AB (ref 0–99)
NonHDL: 143.44
Total CHOL/HDL Ratio: 3
Triglycerides: 87 mg/dL (ref 0.0–149.0)
VLDL: 17.4 mg/dL (ref 0.0–40.0)

## 2014-11-18 LAB — CBC
HCT: 37.3 % (ref 36.0–46.0)
Hemoglobin: 12.5 g/dL (ref 12.0–15.0)
MCHC: 33.5 g/dL (ref 30.0–36.0)
MCV: 89.5 fl (ref 78.0–100.0)
Platelets: 265 10*3/uL (ref 150.0–400.0)
RBC: 4.17 Mil/uL (ref 3.87–5.11)
RDW: 12.3 % (ref 11.5–15.5)
WBC: 8.4 10*3/uL (ref 4.0–10.5)

## 2014-11-18 LAB — COMPREHENSIVE METABOLIC PANEL
ALBUMIN: 4 g/dL (ref 3.5–5.2)
ALK PHOS: 62 U/L (ref 39–117)
ALT: 11 U/L (ref 0–35)
AST: 13 U/L (ref 0–37)
BILIRUBIN TOTAL: 0.6 mg/dL (ref 0.2–1.2)
BUN: 16 mg/dL (ref 6–23)
CO2: 28 mEq/L (ref 19–32)
CREATININE: 0.72 mg/dL (ref 0.40–1.20)
Calcium: 9.2 mg/dL (ref 8.4–10.5)
Chloride: 103 mEq/L (ref 96–112)
GFR: 92.14 mL/min (ref >60.00)
GLUCOSE: 90 mg/dL (ref 70–99)
Potassium: 3.8 mEq/L (ref 3.5–5.1)
SODIUM: 138 meq/L (ref 135–145)
TOTAL PROTEIN: 7.8 g/dL (ref 6.0–8.3)

## 2014-11-18 NOTE — Patient Instructions (Signed)
We will check your blood work today and call you back with the results even if everything is normal.   We will also get the CT of the stomach and pelvis to try to find the cause for the pain or to make sure everything is normal.   If you are doing well come back next year for a physical. If you have new problems or questions please feel free to call us.  Health Maintenance Adopting a healthy lifestyle and getting preventive care can go a long way to promote health and wellness. Talk with your health care provider about what schedule of regular examinations is right for you. This is a good chance for you to check in with your provider about disease prevention and staying healthy. In between checkups, there are plenty of things you can do on your own. Experts have done a lot of research about which lifestyle changes and preventive measures are most likely to keep you healthy. Ask your health care provider for more information. WEIGHT AND DIET  Eat a healthy diet  Be sure to include plenty of vegetables, fruits, low-fat dairy products, and lean protein.  Do not eat a lot of foods high in solid fats, added sugars, or salt.  Get regular exercise. This is one of the most important things you can do for your health.  Most adults should exercise for at least 150 minutes each week. The exercise should increase your heart rate and make you sweat (moderate-intensity exercise).  Most adults should also do strengthening exercises at least twice a week. This is in addition to the moderate-intensity exercise.  Maintain a healthy weight  Body mass index (BMI) is a measurement that can be used to identify possible weight problems. It estimates body fat based on height and weight. Your health care provider can help determine your BMI and help you achieve or maintain a healthy weight.  For females 34 years of age and older:   A BMI below 18.5 is considered underweight.  A BMI of 18.5 to 24.9 is  normal.  A BMI of 25 to 29.9 is considered overweight.  A BMI of 30 and above is considered obese.  Watch levels of cholesterol and blood lipids  You should start having your blood tested for lipids and cholesterol at 47 years of age, then have this test every 5 years.  You may need to have your cholesterol levels checked more often if:  Your lipid or cholesterol levels are high.  You are older than 47 years of age.  You are at high risk for heart disease.  CANCER SCREENING   Lung Cancer  Lung cancer screening is recommended for adults 43-51 years old who are at high risk for lung cancer because of a history of smoking.  A yearly low-dose CT scan of the lungs is recommended for people who:  Currently smoke.  Have quit within the past 15 years.  Have at least a 30-pack-year history of smoking. A pack year is smoking an average of one pack of cigarettes a day for 1 year.  Yearly screening should continue until it has been 15 years since you quit.  Yearly screening should stop if you develop a health problem that would prevent you from having lung cancer treatment.  Breast Cancer  Practice breast self-awareness. This means understanding how your breasts normally appear and feel.  It also means doing regular breast self-exams. Let your health care provider know about any changes, no matter how small.  If you are in your 20s or 30s, you should have a clinical breast exam (CBE) by a health care provider every 1-3 years as part of a regular health exam.  If you are 70 or older, have a CBE every year. Also consider having a breast X-ray (mammogram) every year.  If you have a family history of breast cancer, talk to your health care provider about genetic screening.  If you are at high risk for breast cancer, talk to your health care provider about having an MRI and a mammogram every year.  Breast cancer gene (BRCA) assessment is recommended for women who have family members  with BRCA-related cancers. BRCA-related cancers include:  Breast.  Ovarian.  Tubal.  Peritoneal cancers.  Results of the assessment will determine the need for genetic counseling and BRCA1 and BRCA2 testing. Cervical Cancer Routine pelvic examinations to screen for cervical cancer are no longer recommended for nonpregnant women who are considered low risk for cancer of the pelvic organs (ovaries, uterus, and vagina) and who do not have symptoms. A pelvic examination may be necessary if you have symptoms including those associated with pelvic infections. Ask your health care provider if a screening pelvic exam is right for you.   The Pap test is the screening test for cervical cancer for women who are considered at risk.  If you had a hysterectomy for a problem that was not cancer or a condition that could lead to cancer, then you no longer need Pap tests.  If you are older than 65 years, and you have had normal Pap tests for the past 10 years, you no longer need to have Pap tests.  If you have had past treatment for cervical cancer or a condition that could lead to cancer, you need Pap tests and screening for cancer for at least 20 years after your treatment.  If you no longer get a Pap test, assess your risk factors if they change (such as having a new sexual partner). This can affect whether you should start being screened again.  Some women have medical problems that increase their chance of getting cervical cancer. If this is the case for you, your health care provider may recommend more frequent screening and Pap tests.  The human papillomavirus (HPV) test is another test that may be used for cervical cancer screening. The HPV test looks for the virus that can cause cell changes in the cervix. The cells collected during the Pap test can be tested for HPV.  The HPV test can be used to screen women 34 years of age and older. Getting tested for HPV can extend the interval between normal  Pap tests from three to five years.  An HPV test also should be used to screen women of any age who have unclear Pap test results.  After 47 years of age, women should have HPV testing as often as Pap tests.  Colorectal Cancer  This type of cancer can be detected and often prevented.  Routine colorectal cancer screening usually begins at 47 years of age and continues through 47 years of age.  Your health care provider may recommend screening at an earlier age if you have risk factors for colon cancer.  Your health care provider may also recommend using home test kits to check for hidden blood in the stool.  A small camera at the end of a tube can be used to examine your colon directly (sigmoidoscopy or colonoscopy). This is done to check for  the earliest forms of colorectal cancer.  Routine screening usually begins at age 9.  Direct examination of the colon should be repeated every 5-10 years through 47 years of age. However, you may need to be screened more often if early forms of precancerous polyps or small growths are found. Skin Cancer  Check your skin from head to toe regularly.  Tell your health care provider about any new moles or changes in moles, especially if there is a change in a mole's shape or color.  Also tell your health care provider if you have a mole that is larger than the size of a pencil eraser.  Always use sunscreen. Apply sunscreen liberally and repeatedly throughout the day.  Protect yourself by wearing long sleeves, pants, a wide-brimmed hat, and sunglasses whenever you are outside. HEART DISEASE, DIABETES, AND HIGH BLOOD PRESSURE   Have your blood pressure checked at least every 1-2 years. High blood pressure causes heart disease and increases the risk of stroke.  If you are between 19 years and 30 years old, ask your health care provider if you should take aspirin to prevent strokes.  Have regular diabetes screenings. This involves taking a blood  sample to check your fasting blood sugar level.  If you are at a normal weight and have a low risk for diabetes, have this test once every three years after 47 years of age.  If you are overweight and have a high risk for diabetes, consider being tested at a younger age or more often. PREVENTING INFECTION  Hepatitis B  If you have a higher risk for hepatitis B, you should be screened for this virus. You are considered at high risk for hepatitis B if:  You were born in a country where hepatitis B is common. Ask your health care provider which countries are considered high risk.  Your parents were born in a high-risk country, and you have not been immunized against hepatitis B (hepatitis B vaccine).  You have HIV or AIDS.  You use needles to inject street drugs.  You live with someone who has hepatitis B.  You have had sex with someone who has hepatitis B.  You get hemodialysis treatment.  You take certain medicines for conditions, including cancer, organ transplantation, and autoimmune conditions. Hepatitis C  Blood testing is recommended for:  Everyone born from [birth year] through 1963.  Anyone with known risk factors for hepatitis C. Sexually transmitted infections (STIs)  You should be screened for sexually transmitted infections (STIs) including gonorrhea and chlamydia if:  You are sexually active and are younger than 47 years of age.  You are older than 47 years of age and your health care provider tells you that you are at risk for this type of infection.  Your sexual activity has changed since you were last screened and you are at an increased risk for chlamydia or gonorrhea. Ask your health care provider if you are at risk.  If you do not have HIV, but are at risk, it may be recommended that you take a prescription medicine daily to prevent HIV infection. This is called pre-exposure prophylaxis (PrEP). You are considered at risk if:  You are sexually active and do not  regularly use condoms or know the HIV status of your partner(s).  You take drugs by injection.  You are sexually active with a partner who has HIV. Talk with your health care provider about whether you are at high risk of being infected with HIV. If you  choose to begin PrEP, you should first be tested for HIV. You should then be tested every 3 months for as long as you are taking PrEP.  PREGNANCY   If you are premenopausal and you may become pregnant, ask your health care provider about preconception counseling.  If you may become pregnant, take 400 to 800 micrograms (mcg) of folic acid every day.  If you want to prevent pregnancy, talk to your health care provider about birth control (contraception). OSTEOPOROSIS AND MENOPAUSE   Osteoporosis is a disease in which the bones lose minerals and strength with aging. This can result in serious bone fractures. Your risk for osteoporosis can be identified using a bone density scan.  If you are 101 years of age or older, or if you are at risk for osteoporosis and fractures, ask your health care provider if you should be screened.  Ask your health care provider whether you should take a calcium or vitamin D supplement to lower your risk for osteoporosis.  Menopause may have certain physical symptoms and risks.  Hormone replacement therapy may reduce some of these symptoms and risks. Talk to your health care provider about whether hormone replacement therapy is right for you.  HOME CARE INSTRUCTIONS   Schedule regular health, dental, and eye exams.  Stay current with your immunizations.   Do not use any tobacco products including cigarettes, chewing tobacco, or electronic cigarettes.  If you are pregnant, do not drink alcohol.  If you are breastfeeding, limit how much and how often you drink alcohol.  Limit alcohol intake to no more than 1 drink per day for nonpregnant women. One drink equals 12 ounces of beer, 5 ounces of wine, or 1  ounces of hard liquor.  Do not use street drugs.  Do not share needles.  Ask your health care provider for help if you need support or information about quitting drugs.  Tell your health care provider if you often feel depressed.  Tell your health care provider if you have ever been abused or do not feel safe at home. Document Released: 09/24/2010 Document Revised: 07/26/2013 Document Reviewed: 02/10/2013 [facility] Patient Information 2015 [place], [place]. This information is not intended to replace advice given to you by your health care provider. Make sure you discuss any questions you have with your health care provider.

## 2014-11-18 NOTE — Progress Notes (Signed)
Pre visit review using our clinic review tool, if applicable. No additional management support is needed unless otherwise documented below in the visit note. 

## 2014-11-20 ENCOUNTER — Encounter: Payer: Self-pay | Admitting: Internal Medicine

## 2014-11-20 NOTE — Assessment & Plan Note (Signed)
Has previously had gall bladder and appendix out. Pain is low grade but persistent. Will proceed with CT abdomen and pelvis. Possibly some scar tissue. Checking CMP, lipids, CBC.

## 2014-11-20 NOTE — Progress Notes (Signed)
   Subjective:    Patient ID: Sheila Carter, female    DOB: 1966-03-21, 49 y.o.   MRN: [id number]  HPI The patient is a new 49 YO female coming in with RLQ pain. She has been bothered by it for about 2 years and had previously sought care with her gynecologist. She previously had ovarian cysts however when they left the pain did not improve and her gynecologist suggested it may not be coming from the cysts. Denies diarrhea or constipation. Denies nausea or vomiting. No weight change. Stays constant and does not change.   PMH, [facility], social history reviewed and updated.   Review of Systems  Constitutional: Negative for fever, activity change, appetite change, fatigue and unexpected weight change.  HENT: Negative.   Eyes: Negative.   Respiratory: Negative for cough, chest tightness, shortness of breath and wheezing.   Cardiovascular: Negative for chest pain, palpitations and leg swelling.  Gastrointestinal: Positive for abdominal pain. Negative for diarrhea, constipation, blood in stool and abdominal distention.  Musculoskeletal: Negative.   Skin: Negative.   Neurological: Negative.   Psychiatric/Behavioral: Negative.       Objective:   Physical Exam Filed Vitals:   11/18/14 1558  BP: 120/78  Pulse: 89  Temp: 98 F (36.7 C)  TempSrc: Oral  Resp: 14  Height: 5\' 6"  (1.676 m)  Weight: 160 lb 6.4 oz (72.757 kg)  SpO2: 98%      Assessment & Plan:

## 2014-12-06 ENCOUNTER — Ambulatory Visit (INDEPENDENT_AMBULATORY_CARE_PROVIDER_SITE_OTHER): Payer: 59 | Admitting: Family Medicine

## 2014-12-06 ENCOUNTER — Ambulatory Visit (INDEPENDENT_AMBULATORY_CARE_PROVIDER_SITE_OTHER)
Admission: RE | Admit: 2014-12-06 | Discharge: 2014-12-06 | Disposition: A | Discharge status: Home / Self Care | Payer: 59 | Source: Ambulatory Visit | Attending: Internal Medicine | Admitting: Internal Medicine

## 2014-12-06 ENCOUNTER — Encounter: Payer: Self-pay | Admitting: Family Medicine

## 2014-12-06 VITALS — BP 126/88 | Temp 98.2°F | Ht 66.0 in | Wt 164.0 lb

## 2014-12-06 DIAGNOSIS — H60391 Other infective otitis externa, right ear: Secondary | ICD-10-CM

## 2014-12-06 DIAGNOSIS — R1031 Right lower quadrant pain: Secondary | ICD-10-CM | POA: Diagnosis not present

## 2014-12-06 MED ORDER — IOHEXOL 300 MG/ML  SOLN
100.0000 mL | Freq: Once | INTRAMUSCULAR | Status: AC | PRN
  Administered 2014-12-06: 100 mL via INTRAVENOUS

## 2014-12-06 MED ORDER — CIPROFLOXACIN-DEXAMETHASONE 0.3-0.1 % OT SUSP: 4.0000 [drp] | Freq: Two times a day (BID) | OTIC | Status: DC

## 2014-12-06 NOTE — Progress Notes (Signed)
   Subjective:    Patient ID: Sheila Carter, female    DOB: 07-Oct-1965, 49 y.o.   MRN: [id number]  HPI Here for 2 days of right ear pain. No sinus congestion or fever or ST. She and the family went swimming in a pool last week. She has tried hydrogen peroxide and rubbing alcohol drops with no relief.   Review of Systems  Constitutional: Negative.   HENT: Positive for ear pain. Negative for congestion, postnasal drip and sinus pressure.   Eyes: Negative.   Respiratory: Negative.        Objective:   Physical Exam  Constitutional: She appears well-developed and well-nourished.  HENT:  Left Ear: External ear normal.  Nose: Nose normal.  Mouth/Throat: Oropharynx is clear and moist. No oropharyngeal exudate.  Right external canal is red and swollen. The right TM is clear.   Eyes: Conjunctivae are normal.  Neck: No thyromegaly present.  Pulmonary/Chest: Effort normal and breath sounds normal.  Lymphadenopathy:    She has no cervical adenopathy.          Assessment & Plan:  Otitis externa, treat with Ciprodex drops

## 2014-12-06 NOTE — Progress Notes (Signed)
Pre visit review using our clinic review tool, if applicable. No additional management support is needed unless otherwise documented below in the visit note. 

## 2014-12-13 ENCOUNTER — Telehealth: Payer: Self-pay | Admitting: Internal Medicine

## 2014-12-13 NOTE — Telephone Encounter (Signed)
Patient has returned your call Please call her back

## 2015-01-26 ENCOUNTER — Other Ambulatory Visit: Payer: 59

## 2015-01-26 ENCOUNTER — Ambulatory Visit (INDEPENDENT_AMBULATORY_CARE_PROVIDER_SITE_OTHER): Payer: 59 | Admitting: Family

## 2015-01-26 ENCOUNTER — Encounter: Payer: Self-pay | Admitting: Family

## 2015-01-26 VITALS — BP 132/86 | HR 87 | Temp 97.6°F | Resp 18 | Ht 66.0 in | Wt 162.0 lb

## 2015-01-26 DIAGNOSIS — R079 Chest pain, unspecified: Secondary | ICD-10-CM | POA: Insufficient documentation

## 2015-01-26 DIAGNOSIS — R1013 Epigastric pain: Secondary | ICD-10-CM

## 2015-01-26 DIAGNOSIS — K219 Gastro-esophageal reflux disease without esophagitis: Secondary | ICD-10-CM | POA: Insufficient documentation

## 2015-01-26 MED ORDER — RANITIDINE HCL 150 MG PO TABS: 150.0000 mg | ORAL_TABLET | Freq: Every day | ORAL | Status: DC

## 2015-01-26 NOTE — Assessment & Plan Note (Deleted)
Symptoms and exam consistent with GERD exacerbation. In office ECG negative for cardiac origin. Continue current dosage of lansoprazole. Start Zantac for the next week and then as needed. Obtain H. Pylori to rule out infection. Follow up if symptoms worsen or fail to improve.

## 2015-01-26 NOTE — Progress Notes (Signed)
Subjective:    Patient ID: Sheila Carter, female    DOB: 1966/01/07, 49 y.o.   MRN: [id number]  Chief Complaint  Patient presents with  . Heartburn    has had heartburn since saturday, states that she has been feeling in her back and her stomach has not been feeling well    HPI:  Sheila Carter is a 49 y.o. female who  has a past medical history of Abnormal pap; Anemia; Thyroid nodule; Gallbladder disease (2010); Anal fissure; and Melanoma ([place]) (2002). and presents today for an acute office visit.   Associated symptom of chest pain located in the center of her chest has been going on for about 5 days. Describes the pain as radiating through to her back. Modifying factors include lansoprazole daily. Improved for about 2 days and experienced a burning pain last evening. Timing of the symptoms are constant when they occur and usually at night is the worst. Denies any constipation or diarrhea. Aggravating factors include acidic foods.    Allergies  Allergen Reactions  . Sulfonamide Derivatives     REACTION: Rash fever     Current Outpatient Prescriptions on File Prior to Visit  Medication Sig Dispense Refill  . desogestrel-ethinyl estradiol (AZURETTE) 0.15-0.02/0.01 MG (21/5) tablet Take 1 tablet by mouth daily.    . lansoprazole (PREVACID SOLUTAB) 30 MG disintegrating tablet Take 1 tablet (30 mg total) by mouth daily. 30 tablet 11   No current facility-administered medications on file prior to visit.    Past Medical History  Diagnosis Date  . Abnormal pap   . Anemia   . Thyroid nodule   . Gallbladder disease 2010  . Anal fissure   . Melanoma ([place]) 2002    Past Surgical History  Procedure Laterality Date  . Adenoidectomy  1980  . Appendectomy  1984  . Nose surgery  1992  . Gallbladder surgery  2010    Review of Systems  Constitutional: Negative for fever, chills and diaphoresis.  Respiratory: Negative for chest tightness and shortness of breath.     Cardiovascular: Positive for chest pain. Negative for palpitations and leg swelling.  Gastrointestinal: Positive for abdominal pain. Negative for nausea, vomiting, diarrhea and constipation.  Neurological: Negative for headaches.      Objective:    BP 132/86 mmHg  Pulse 87  Temp(Src) 97.6 F (36.4 C) (Oral)  Resp 18  Ht 5\' 6"  (1.676 m)  Wt 162 lb (73.483 kg)  BMI 26.16 kg/m2  SpO2 99% Nursing note and vital signs reviewed.   Physical Exam  Constitutional: She is oriented to person, place, and time. She appears well-developed and well-nourished. No distress.  Cardiovascular: Normal rate, regular rhythm, normal heart sounds and intact distal pulses.   Pulmonary/Chest: Effort normal and breath sounds normal.  Abdominal: Normal appearance and bowel sounds are normal. She exhibits no mass. There is no hepatosplenomegaly. There is tenderness. There is no rigidity, no rebound, no guarding, no tenderness at McBurney's point and negative Murphy's sign.  Neurological: She is alert and oriented to person, place, and time.  Skin: Skin is warm and dry.  Psychiatric: She has a normal mood and affect. Her behavior is normal. Judgment and thought content normal.       Assessment & Plan:   Problem List Items Addressed This Visit      Other   Epigastric abdominal pain   Relevant Medications   ranitidine (ZANTAC) 150 MG tablet   Other Relevant Orders   EKG 12-Lead (Completed)  H Pylori, IGM, IGG, IGA AB   Pain in the chest - Primary    Symptoms and exam consistent with GERD exacerbation. In office ECG negative for cardiac origin. Continue current dosage of lansoprazole. Start Zantac for the next week and then as needed. Obtain H. Pylori to rule out infection. Follow up if symptoms worsen or fail to improve.       Relevant Medications   ranitidine (ZANTAC) 150 MG tablet   Other Relevant Orders   EKG 12-Lead (Completed)   H Pylori, IGM, IGG, IGA AB

## 2015-01-26 NOTE — Patient Instructions (Addendum)
Thank you for choosing [facility].  Summary/Instructions:  Your prescription(s) have been submitted to your pharmacy or been printed and provided for you. Please take as directed and contact our office if you believe you are having problem(s) with the medication(s) or have any questions.  If your symptoms worsen or fail to improve, please contact our office for further instruction, or in case of emergency go directly to the emergency room at the closest medical facility.    Food Choices for Gastroesophageal Reflux Disease, Adult When you have gastroesophageal reflux disease (GERD), the foods you eat and your eating habits are very important. Choosing the right foods can help ease the discomfort of GERD. WHAT GENERAL GUIDELINES DO I NEED TO FOLLOW?  Choose fruits, vegetables, whole grains, low-fat dairy products, and low-fat meat, fish, and poultry.  Limit fats such as oils, salad dressings, butter, nuts, and avocado.  Keep a food diary to identify foods that cause symptoms.  Avoid foods that cause reflux. These may be different for different people.  Eat frequent small meals instead of three large meals each day.  Eat your meals slowly, in a relaxed setting.  Limit fried foods.  Cook foods using methods other than frying.  Avoid drinking alcohol.  Avoid drinking large amounts of liquids with your meals.  Avoid bending over or lying down until 2-3 hours after eating. WHAT FOODS ARE NOT RECOMMENDED? The following are some foods and drinks that may worsen your symptoms: Vegetables Tomatoes. Tomato juice. Tomato and spaghetti sauce. Chili peppers. Onion and garlic. Horseradish. Fruits Oranges, grapefruit, and lemon (fruit and juice). Meats High-fat meats, fish, and poultry. This includes hot dogs, ribs, ham, sausage, salami, and bacon. Dairy Whole milk and chocolate milk. Sour cream. Cream. Butter. Ice cream. Cream cheese.  Beverages Coffee and tea, with or without  caffeine. Carbonated beverages or energy drinks. Condiments Hot sauce. Barbecue sauce.  Sweets/Desserts Chocolate and cocoa. Donuts. Peppermint and spearmint. Fats and Oils High-fat foods, including [place] fries and potato chips. Other Vinegar. Strong spices, such as black pepper, white pepper, red pepper, cayenne, curry powder, cloves, ginger, and chili powder. The items listed above may not be a complete list of foods and beverages to avoid. Contact your dietitian for more information.   This information is not intended to replace advice given to you by your health care provider. Make sure you discuss any questions you have with your health care provider.   Document Released: 03/11/2005 Document Revised: 04/01/2014 Document Reviewed: 01/13/2013 Elsevier Interactive Patient Education 2016 [place].  Gastroesophageal Reflux Disease, Adult Normally, food travels down the esophagus and stays in the stomach to be digested. However, when a person has gastroesophageal reflux disease (GERD), food and stomach acid move back up into the esophagus. When this happens, the esophagus becomes sore and inflamed. Over time, GERD can create small holes (ulcers) in the lining of the esophagus.  CAUSES This condition is caused by a problem with the muscle between the esophagus and the stomach (lower esophageal sphincter, or LES). Normally, the LES muscle closes after food passes through the esophagus to the stomach. When the LES is weakened or abnormal, it does not close properly, and that allows food and stomach acid to go back up into the esophagus. The LES can be weakened by certain dietary substances, medicines, and medical conditions, including:  Tobacco use.  Pregnancy.  Having a hiatal hernia.  Heavy alcohol use.  Certain foods and beverages, such as coffee, chocolate, onions, and peppermint. RISK  FACTORS This condition is more likely to develop in:  People who have an increased body  weight.  People who have connective tissue disorders.  People who use NSAID medicines. SYMPTOMS Symptoms of this condition include:  Heartburn.  Difficult or painful swallowing.  The feeling of having a lump in the throat.  Abitter taste in the mouth.  Bad breath.  Having a large amount of saliva.  Having an upset or bloated stomach.  Belching.  Chest pain.  Shortness of breath or wheezing.  Ongoing (chronic) cough or a night-time cough.  Wearing away of tooth enamel.  Weight loss. Different conditions can cause chest pain. Make sure to see your health care provider if you experience chest pain. DIAGNOSIS Your health care provider will take a medical history and perform a physical exam. To determine if you have mild or severe GERD, your health care provider may also monitor how you respond to treatment. You may also have other tests, including:  An endoscopy toexamine your stomach and esophagus with a small camera.  A test thatmeasures the acidity level in your esophagus.  A test thatmeasures how much pressure is on your esophagus.  A barium swallow or modified barium swallow to show the shape, size, and functioning of your esophagus. TREATMENT The goal of treatment is to help relieve your symptoms and to prevent complications. Treatment for this condition may vary depending on how severe your symptoms are. Your health care provider may recommend:  Changes to your diet.  Medicine.  Surgery. HOME CARE INSTRUCTIONS Diet  Follow a diet as recommended by your health care provider. This may involve avoiding foods and drinks such as:  Coffee and tea (with or without caffeine).  Drinks that containalcohol.  Energy drinks and sports drinks.  Carbonated drinks or sodas.  Chocolate and cocoa.  Peppermint and mint flavorings.  Garlic and onions.  Horseradish.  Spicy and acidic foods, including peppers, chili powder, curry powder, vinegar, hot sauces,  and barbecue sauce.  Citrus fruit juices and citrus fruits, such as oranges, lemons, and limes.  Tomato-based foods, such as red sauce, chili, salsa, and pizza with red sauce.  Fried and fatty foods, such as donuts, french fries, potato chips, and high-fat dressings.  High-fat meats, such as hot dogs and fatty cuts of red and white meats, such as rib eye steak, sausage, ham, and bacon.  High-fat dairy items, such as whole milk, butter, and cream cheese.  Eat small, frequent meals instead of large meals.  Avoid drinking large amounts of liquid with your meals.  Avoid eating meals during the 2-3 hours before bedtime.  Avoid lying down right after you eat.  Do not exercise right after you eat. General Instructions  Pay attention to any changes in your symptoms.  Take over-the-counter and prescription medicines only as told by your health care provider. Do not take aspirin, ibuprofen, or other NSAIDs unless your health care provider told you to do so.  Do not use any tobacco products, including cigarettes, chewing tobacco, and e-cigarettes. If you need help quitting, ask your health care provider.  Wear loose-fitting clothing. Do not wear anything tight around your waist that causes pressure on your abdomen.  Raise (elevate) the head of your bed 6 inches (15cm).  Try to reduce your stress, such as with yoga or meditation. If you need help reducing stress, ask your health care provider.  If you are overweight, reduce your weight to an amount that is healthy for you. Ask your  health care provider for guidance about a safe weight loss goal.  Keep all follow-up visits as told by your health care provider. This is important. SEEK MEDICAL CARE IF:  You have new symptoms.  You have unexplained weight loss.  You have difficulty swallowing, or it hurts to swallow.  You have wheezing or a persistent cough.  Your symptoms do not improve with treatment.  You have a hoarse  voice. SEEK IMMEDIATE MEDICAL CARE IF:  You have pain in your arms, neck, jaw, teeth, or back.  You feel sweaty, dizzy, or light-headed.  You have chest pain or shortness of breath.  You vomit and your vomit looks like blood or coffee grounds.  You faint.  Your stool is bloody or black.  You cannot swallow, drink, or eat.   This information is not intended to replace advice given to you by your health care provider. Make sure you discuss any questions you have with your health care provider.   Document Released: 12/19/2004 Document Revised: 11/30/2014 Document Reviewed: 07/06/2014 Elsevier Interactive Patient Education [employer].

## 2015-01-26 NOTE — Assessment & Plan Note (Signed)
Symptoms and exam consistent with GERD exacerbation. In office ECG negative for cardiac origin. Continue current dosage of lansoprazole. Start Zantac for the next week and then as needed. Obtain H. Pylori to rule out infection. Follow up if symptoms worsen or fail to improve.

## 2015-01-26 NOTE — Progress Notes (Signed)
Pre visit review using our clinic review tool, if applicable. No additional management support is needed unless otherwise documented below in the visit note. 

## 2015-01-28 LAB — H PYLORI, IGM, IGG, IGA AB: H Pylori IgG: <0.9 U/mL (ref 0.0–0.8)

## 2015-01-29 ENCOUNTER — Telehealth: Payer: Self-pay | Admitting: Family

## 2015-01-29 NOTE — Telephone Encounter (Signed)
Please inform patient that her H. Pylori was all negative. Therefore please continue with the current treatment as we discussed.

## 2015-01-30 NOTE — Telephone Encounter (Signed)
Pt aware of results 

## 2016-05-04 ENCOUNTER — Ambulatory Visit (INDEPENDENT_AMBULATORY_CARE_PROVIDER_SITE_OTHER): Payer: 59 | Admitting: Family Medicine

## 2016-05-04 ENCOUNTER — Encounter: Payer: Self-pay | Admitting: Family Medicine

## 2016-05-04 VITALS — BP 126/84 | HR 82 | Temp 98.2°F | Resp 20 | Ht 66.0 in | Wt 170.0 lb

## 2016-05-04 DIAGNOSIS — R209 Unspecified disturbances of skin sensation: Secondary | ICD-10-CM

## 2016-05-04 DIAGNOSIS — M79672 Pain in left foot: Secondary | ICD-10-CM

## 2016-05-04 NOTE — Progress Notes (Signed)
Pre visit review using our clinic review tool, if applicable. No additional management support is needed unless otherwise documented below in the visit note. 

## 2016-05-04 NOTE — Patient Instructions (Addendum)
Please take ibuprofen 600 mg every 6 hours for discomfort in foot for 3 to 4 days and follow up if symptoms persist.  Also, wear shoes that provide support that will help with foot discomfort.   Please follow up with your provider as we discussed for lab work and follow up if sensation of coldness in your skin continues.

## 2016-05-04 NOTE — Progress Notes (Signed)
Subjective:    Patient ID: Sheila Carter, female    DOB: [birth year], 51 y.o.   MRN: [id number]  HPI  Sheila Carter is a 51 year old female who presents today with cold tingling feeling in her feet and legs that started 3 months ago, resolved, and presented again one week ago. She states that this occurs intermittently and can be in either her legs and feet and can be either left or right sided.  Associated pain in the ball of her left foot that has been present for 3 weeks. Pain is rated as an "ache" and does not interfere with ambulation. Wearing tennis shoes improves discomfort. She denies fever, chills, sweats, numbness, weakness, difficulty ambulating, or pain in arms/legs Alleviating factor is sleep as she reports that this is not noticed at times in the morning. Aggravating factor: none No treatment at home  No significant chronic medical conditions.  Last visit with PCP in 2016.  Review of Systems  Constitutional: Negative for chills, fatigue and fever.  Respiratory: Negative for cough, shortness of breath and wheezing.   Cardiovascular: Negative for chest pain, palpitations and leg swelling.  Gastrointestinal: Negative for abdominal pain, diarrhea, nausea and vomiting.  Musculoskeletal: Negative for myalgias.  Skin: Negative for rash.       Cold sensation in arms and legs. Pain on ball of foot  Neurological: Negative for dizziness, light-headedness and headaches.   Past Medical History:  Diagnosis Date  . Abnormal pap   . Anal fissure   . Anemia   . Gallbladder disease 2010  . Melanoma ([place]) 2002  . Thyroid nodule      Social History   Social History  . Marital status: Married    Spouse name: N/A  . Number of children: 2  . 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 Smoking status: Never Smoker  . Smokeless tobacco: Never Used  . Alcohol use 0.0 oz/week     Comment: occ  . Drug use: No  . Sexual activity: Not on  file   Other Topics Concern  . Not on file   Social History Narrative  . No narrative on file    Past Surgical History:  Procedure Laterality Date  . ADENOIDECTOMY  1980  . APPENDECTOMY  1984  . GALLBLADDER SURGERY  2010  . NOSE SURGERY  1992    Family History  Problem Relation Age of Onset  . Heart disease Maternal Grandmother   . Hypothyroidism Mother   . Gallbladder disease Sister     #1  . Nephrolithiasis Father   . Diverticulosis Mother   . Hypertension Mother   . Heart disease Father   . Gallbladder disease Sister     #2  . Diverticulosis Maternal Grandfather   . Hypertension Father   . Hypertension Sister     x 2    Allergies  Allergen Reactions  . Sulfonamide Derivatives     REACTION: Rash fever    Current Outpatient Prescriptions on File Prior to Visit  Medication Sig Dispense Refill  . ranitidine (ZANTAC) 150 MG tablet Take 1-2 tablets (150-300 mg total) by mouth at bedtime. (Patient taking differently: Take 150-300 mg by mouth as needed. ) 60 tablet 3   No current facility-administered medications on file prior to visit.     BP 126/84 (BP Location: Left Arm, Patient Position: Sitting, Cuff Size: Normal)   Pulse 82   Temp 98.2  F (36.8 C) (Oral)   Resp 20   Ht 5\' 6"  (1.676 m)   Wt 170 lb (77.1 kg)   SpO2 99%   BMI 27.44 kg/m       Objective:   Physical Exam  Constitutional: She is oriented to person, place, and time. She appears well-developed and well-nourished.  Eyes: No scleral icterus.  Cardiovascular: Normal rate and regular rhythm.   Pulses:      Radial pulses are 2+ on the right side, and 2+ on the left side.       Dorsalis pedis pulses are 2+ on the right side, and 2+ on the left side.       Posterior tibial pulses are 2+ on the right side, and 2+ on the left side.  Pulmonary/Chest: Effort normal and breath sounds normal. She has no wheezes.  Neurological: She is alert and oriented to person, place, and time. She has normal  strength. No sensory deficit. Coordination and gait normal.  Motor: 5/5 bilaterally with normal tone and bulk Cerebellar: Ambulates with a coordinated gait   Skin: Skin is warm, dry and intact. No rash noted. No pallor.  Psychiatric: She has a normal mood and affect. Her behavior is normal. Judgment and thought content normal.       Assessment & Plan:  1. Cold sensation of skin Exam is reassuring; skin is warm to touch but is perceived by patient to be cold at times; Normal sensation to sharp and dull; normal neuro exam; advised patient that exam is reassuring today and she should follow up with her PCP for routine care and lab work. If symptoms persist, we discussed the likelihood that PCP will check a lab panel including thyroid function tests.  2. Left foot pain Normal foot exam; neuro exam normal; suspect symptoms are caused by poor fitting shoes without proper support. Advised use of ibuprofen for 3 to 4 days with food and follow up if symptoms do not improve, worsen, or she develop new symptoms.   Delano Metz, FNP-C

## 2016-05-06 ENCOUNTER — Other Ambulatory Visit (INDEPENDENT_AMBULATORY_CARE_PROVIDER_SITE_OTHER): Payer: 59

## 2016-05-06 ENCOUNTER — Encounter: Payer: Self-pay | Admitting: Internal Medicine

## 2016-05-06 ENCOUNTER — Ambulatory Visit (INDEPENDENT_AMBULATORY_CARE_PROVIDER_SITE_OTHER): Payer: 59 | Admitting: Internal Medicine

## 2016-05-06 VITALS — BP 130/70 | HR 81 | Temp 98.1°F | Ht 66.0 in | Wt 170.0 lb

## 2016-05-06 DIAGNOSIS — R200 Anesthesia of skin: Secondary | ICD-10-CM | POA: Insufficient documentation

## 2016-05-06 DIAGNOSIS — R202 Paresthesia of skin: Secondary | ICD-10-CM

## 2016-05-06 LAB — CBC
HCT: 39.1 % (ref 36.0–46.0)
Hemoglobin: 13.1 g/dL (ref 12.0–15.0)
MCHC: 33.4 g/dL (ref 30.0–36.0)
MCV: 89 fl (ref 78.0–100.0)
Platelets: 264 10*3/uL (ref 150.0–400.0)
RBC: 4.39 Mil/uL (ref 3.87–5.11)
RDW: 12.3 % (ref 11.5–15.5)
WBC: 5.9 10*3/uL (ref 4.0–10.5)

## 2016-05-06 LAB — COMPREHENSIVE METABOLIC PANEL
ALBUMIN: 4.3 g/dL (ref 3.5–5.2)
ALK PHOS: 108 U/L (ref 39–117)
ALT: 24 U/L (ref 0–35)
AST: 23 U/L (ref 0–37)
BILIRUBIN TOTAL: 0.7 mg/dL (ref 0.2–1.2)
BUN: 19 mg/dL (ref 6–23)
CALCIUM: 9.6 mg/dL (ref 8.4–10.5)
CO2: 28 mEq/L (ref 19–32)
CREATININE: 0.83 mg/dL (ref 0.40–1.20)
Chloride: 104 mEq/L (ref 96–112)
GFR: 77.72 mL/min (ref >60.00)
Glucose, Bld: 94 mg/dL (ref 70–99)
Potassium: 4 mEq/L (ref 3.5–5.1)
Sodium: 139 mEq/L (ref 135–145)
TOTAL PROTEIN: 7.8 g/dL (ref 6.0–8.3)

## 2016-05-06 LAB — VITAMIN B12: VITAMIN B 12: 783 pg/mL (ref 211–911)

## 2016-05-06 LAB — TSH: TSH: 2.41 u[IU]/mL (ref 0.35–4.50)

## 2016-05-06 LAB — HEMOGLOBIN A1C: Hgb A1c MFr Bld: 5.4 % (ref 4.6–6.5)

## 2016-05-06 NOTE — Progress Notes (Signed)
   Subjective:    Patient ID: Sheila Carter, female    DOB: 10/24/1965, 51 y.o.   MRN: [id number]  HPI The patient is a 51 YO female coming in for tingling and cold sensation in her arms and legs. Mostly when sedentary which she is often. She is able to rub her arms or legs and the cold sensation disappears. The tingling is not noticed when she is walking. No numbness or weakness in her arms or legs. No change in speech or face. Overall stable since onset. Going on for about 1 week. No weight change but is gaining some weight recently. Her gyn also told her she is in menopause since the fall.   Review of Systems  Constitutional: Negative.   Respiratory: Negative.   Cardiovascular: Negative.   Gastrointestinal: Negative.   Musculoskeletal: Negative.   Neurological: Negative for dizziness, tremors, seizures, syncope, facial asymmetry, speech difficulty, weakness, light-headedness and numbness.       Tingling in hands and feet  Psychiatric/Behavioral:       Increased stress      Objective:   Physical Exam  Constitutional: She is oriented to person, place, and time. She appears well-developed and well-nourished.  HENT:  Head: Normocephalic and atraumatic.  Eyes: EOM are normal.  Neck: Normal range of motion.  Cardiovascular: Normal rate and regular rhythm.   Pulmonary/Chest: Effort normal and breath sounds normal.  Abdominal: Soft.  Musculoskeletal: She exhibits no edema.  Neurological: She is alert and oriented to person, place, and time. No cranial nerve deficit. Coordination normal.  Strength and sensation intact upper and lower extremity bilateral   Vitals:   05/06/16 0820  BP: 130/70  Pulse: 81  Temp: 98.1 F (36.7 C)  TempSrc: Oral  SpO2: 98%  Weight: 170 lb (77.1 kg)  Height: 5\' 6"  (1.676 m)      Assessment & Plan:

## 2016-05-06 NOTE — Assessment & Plan Note (Signed)
Checking labs today for cause. She does have known vitamin D deficiency and gyn is treating her for this. She is not on therapy long enough for recheck at this time. Likely benign etiology given symptoms and course.

## 2016-05-06 NOTE — Patient Instructions (Signed)
We are checking the labs today and will send the results on mychart.   We would recommend to come back for a physical at some point.

## 2016-05-06 NOTE — Progress Notes (Signed)
Pre visit review using our clinic review tool, if applicable. No additional management support is needed unless otherwise documented below in the visit note. 

## 2016-05-15 ENCOUNTER — Encounter: Payer: 59 | Admitting: Internal Medicine

## 2016-06-12 ENCOUNTER — Encounter: Payer: 59 | Admitting: Internal Medicine

## 2016-06-27 ENCOUNTER — Encounter: Payer: 59 | Admitting: Internal Medicine

## 2016-07-01 ENCOUNTER — Ambulatory Visit (INDEPENDENT_AMBULATORY_CARE_PROVIDER_SITE_OTHER): Payer: 59 | Admitting: Internal Medicine

## 2016-07-01 ENCOUNTER — Encounter: Payer: Self-pay | Admitting: Internal Medicine

## 2016-07-01 ENCOUNTER — Ambulatory Visit (INDEPENDENT_AMBULATORY_CARE_PROVIDER_SITE_OTHER)
Admission: RE | Admit: 2016-07-01 | Discharge: 2016-07-01 | Disposition: A | Discharge status: Home / Self Care | Payer: 59 | Source: Ambulatory Visit | Attending: Internal Medicine | Admitting: Internal Medicine

## 2016-07-01 ENCOUNTER — Other Ambulatory Visit (INDEPENDENT_AMBULATORY_CARE_PROVIDER_SITE_OTHER): Payer: 59

## 2016-07-01 VITALS — BP 122/80 | HR 85 | Temp 97.9°F | Resp 12 | Ht 66.0 in | Wt 170.0 lb

## 2016-07-01 DIAGNOSIS — G8929 Other chronic pain: Secondary | ICD-10-CM | POA: Diagnosis not present

## 2016-07-01 DIAGNOSIS — M25562 Pain in left knee: Secondary | ICD-10-CM | POA: Diagnosis not present

## 2016-07-01 DIAGNOSIS — Z0000 Encounter for general adult medical examination without abnormal findings: Secondary | ICD-10-CM | POA: Diagnosis not present

## 2016-07-01 DIAGNOSIS — M25561 Pain in right knee: Secondary | ICD-10-CM

## 2016-07-01 DIAGNOSIS — E041 Nontoxic single thyroid nodule: Secondary | ICD-10-CM

## 2016-07-01 LAB — LIPID PANEL
CHOL/HDL RATIO: 3
CHOLESTEROL: 181 mg/dL (ref 0–200)
HDL: 58.8 mg/dL (ref >39.00)
LDL Cholesterol: 110 mg/dL — ABNORMAL HIGH (ref 0–99)
NonHDL: 121.9
TRIGLYCERIDES: 61 mg/dL (ref 0.0–149.0)
VLDL: 12.2 mg/dL (ref 0.0–40.0)

## 2016-07-01 LAB — VITAMIN D 25 HYDROXY (VIT D DEFICIENCY, FRACTURES): VITD: 37.66 ng/mL (ref 30.00–100.00)

## 2016-07-01 NOTE — Progress Notes (Signed)
   Subjective:    Patient ID: Sheila Carter, female    DOB: November 17, 1965, 51 y.o.   MRN: [id number]  HPI The patient is a 51 YO female coming in for wellness. No new concerns.   PMH, [facility], social history reviewed and updated.   Review of Systems  Constitutional: Negative.   HENT: Negative.   Eyes: Negative.   Respiratory: Negative for cough, chest tightness and shortness of breath.   Cardiovascular: Negative for chest pain, palpitations and leg swelling.  Gastrointestinal: Negative for abdominal distention, abdominal pain, constipation, diarrhea, nausea and vomiting.  Musculoskeletal: Positive for arthralgias. Negative for back pain, gait problem, joint swelling, myalgias and neck pain.  Skin: Negative.   Neurological: Negative.   Psychiatric/Behavioral: Negative.       Objective:   Physical Exam  Constitutional: She is oriented to person, place, and time. She appears well-developed and well-nourished.  HENT:  Head: Normocephalic and atraumatic.  Eyes: EOM are normal.  Neck: Normal range of motion.  Cardiovascular: Normal rate and regular rhythm.   Pulmonary/Chest: Effort normal and breath sounds normal. No respiratory distress. She has no wheezes. She has no rales.  Abdominal: Soft. Bowel sounds are normal. She exhibits no distension. There is no tenderness. There is no rebound.  Musculoskeletal: She exhibits no edema.  Neurological: She is alert and oriented to person, place, and time. Coordination normal.  Skin: Skin is warm and dry.  Psychiatric: She has a normal mood and affect.   Vitals:   07/01/16 0759  BP: 122/80  Pulse: 85  Resp: 12  Temp: 97.9 F (36.6 C)  TempSrc: Oral  SpO2: 99%  Weight: 170 lb (77.1 kg)  Height: 5\' 6"  (1.676 m)      Assessment & Plan:

## 2016-07-01 NOTE — Assessment & Plan Note (Signed)
Last 2010 follow up 1.6 cm. Needs follow up every 2-3 years if no growth. TSH normal, prior biopsy 2009 without findings.

## 2016-07-01 NOTE — Assessment & Plan Note (Signed)
Mammogram and pap at gyn. Declines need for hiv screening. Tetanus up to date. Colonoscopy due at 59. Counseled about exercise, sun safety, dangers of distracted driving. Given screening recommendations.

## 2016-07-01 NOTE — Patient Instructions (Signed)
We will do the x-ray of the knees and labs today.   You will get a call about the ultrasound of the neck.   Glucosamine and turmeric are both good vitamins for the joints to help reduce pain.   Health Maintenance, Female Adopting a healthy lifestyle and getting preventive care can go a long way to promote health and wellness. Talk with your health care provider about what schedule of regular examinations is right for you. This is a good chance for you to check in with your provider about disease prevention and staying healthy. In between checkups, there are plenty of things you can do on your own. Experts have done a lot of research about which lifestyle changes and preventive measures are most likely to keep you healthy. Ask your health care provider for more information. Weight and diet Eat a healthy diet  Be sure to include plenty of vegetables, fruits, low-fat dairy products, and lean protein.  Do not eat a lot of foods high in solid fats, added sugars, or salt.  Get regular exercise. This is one of the most important things you can do for your health.  Most adults should exercise for at least 150 minutes each week. The exercise should increase your heart rate and make you sweat (moderate-intensity exercise).  Most adults should also do strengthening exercises at least twice a week. This is in addition to the moderate-intensity exercise. Maintain a healthy weight  Body mass index (BMI) is a measurement that can be used to identify possible weight problems. It estimates body fat based on height and weight. Your health care provider can help determine your BMI and help you achieve or maintain a healthy weight.  For females 49 years of age and older:  A BMI below 18.5 is considered underweight.  A BMI of 18.5 to 24.9 is normal.  A BMI of 25 to 29.9 is considered overweight.  A BMI of 30 and above is considered obese. Watch levels of cholesterol and blood lipids  You should start  having your blood tested for lipids and cholesterol at 49 years of age, then have this test every 5 years.  You may need to have your cholesterol levels checked more often if:  Your lipid or cholesterol levels are high.  You are older than 49 years of age.  You are at high risk for heart disease. Cancer screening Lung Cancer  Lung cancer screening is recommended for adults 49-41 years old who are at high risk for lung cancer because of a history of smoking.  A yearly low-dose CT scan of the lungs is recommended for people who:  Currently smoke.  Have quit within the past 15 years.  Have at least a 30-pack-year history of smoking. A pack year is smoking an average of one pack of cigarettes a day for 1 year.  Yearly screening should continue until it has been 15 years since you quit.  Yearly screening should stop if you develop a health problem that would prevent you from having lung cancer treatment. Breast Cancer  Practice breast self-awareness. This means understanding how your breasts normally appear and feel.  It also means doing regular breast self-exams. Let your health care provider know about any changes, no matter how small.  If you are in your 20s or 30s, you should have a clinical breast exam (CBE) by a health care provider every 1-3 years as part of a regular health exam.  If you are 7 or older, have a  CBE every year. Also consider having a breast X-ray (mammogram) every year.  If you have a family history of breast cancer, talk to your health care provider about genetic screening.  If you are at high risk for breast cancer, talk to your health care provider about having an MRI and a mammogram every year.  Breast cancer gene (BRCA) assessment is recommended for women who have family members with BRCA-related cancers. BRCA-related cancers include:  Breast.  Ovarian.  Tubal.  Peritoneal cancers.  Results of the assessment will determine the need for genetic  counseling and BRCA1 and BRCA2 testing. Cervical Cancer  Your health care provider may recommend that you be screened regularly for cancer of the pelvic organs (ovaries, uterus, and vagina). This screening involves a pelvic examination, including checking for microscopic changes to the surface of your cervix (Pap test). You may be encouraged to have this screening done every 3 years, beginning at age 49.  For women ages 49-65, health care providers may recommend pelvic exams and Pap testing every 3 years, or they may recommend the Pap and pelvic exam, combined with testing for human papilloma virus (HPV), every 5 years. Some types of HPV increase your risk of cervical cancer. Testing for HPV may also be done on women of any age with unclear Pap test results.  Other health care providers may not recommend any screening for nonpregnant women who are considered low risk for pelvic cancer and who do not have symptoms. Ask your health care provider if a screening pelvic exam is right for you.  If you have had past treatment for cervical cancer or a condition that could lead to cancer, you need Pap tests and screening for cancer for at least 20 years after your treatment. If Pap tests have been discontinued, your risk factors (such as having a new sexual partner) need to be reassessed to determine if screening should resume. Some women have medical problems that increase the chance of getting cervical cancer. In these cases, your health care provider may recommend more frequent screening and Pap tests. Colorectal Cancer  This type of cancer can be detected and often prevented.  Routine colorectal cancer screening usually begins at 49 years of age and continues through 49 years of age.  Your health care provider may recommend screening at an earlier age if you have risk factors for colon cancer.  Your health care provider may also recommend using home test kits to check for hidden blood in the stool.  A  small camera at the end of a tube can be used to examine your colon directly (sigmoidoscopy or colonoscopy). This is done to check for the earliest forms of colorectal cancer.  Routine screening usually begins at age 49 8.  Direct examination of the colon should be repeated every 5-10 years through 49 years of age. However, you may need to be screened more often if early forms of precancerous polyps or small growths are found. Skin Cancer  Check your skin from head to toe regularly.  Tell your health care provider about any new moles or changes in moles, especially if there is a change in a mole's shape or color.  Also tell your health care provider if you have a mole that is larger than the size of a pencil eraser.  Always use sunscreen. Apply sunscreen liberally and repeatedly throughout the day.  Protect yourself by wearing long sleeves, pants, a wide-brimmed hat, and sunglasses whenever you are outside. Heart disease, diabetes, and high  blood pressure  High blood pressure causes heart disease and increases the risk of stroke. High blood pressure is more likely to develop in:  People who have blood pressure in the high end of the normal range (130-139/85-89 mm Hg).  People who are overweight or obese.  People who are African American.  If you are 59-79 years of age, have your blood pressure checked every 3-5 years. If you are 78 years of age or older, have your blood pressure checked every year. You should have your blood pressure measured twice-once when you are at a hospital or clinic, and once when you are not at a hospital or clinic. Record the average of the two measurements. To check your blood pressure when you are not at a hospital or clinic, you can use:  An automated blood pressure machine at a pharmacy.  A home blood pressure monitor.  If you are between 68 years and 45 years old, ask your health care provider if you should take aspirin to prevent strokes.  Have regular  diabetes screenings. This involves taking a blood sample to check your fasting blood sugar level.  If you are at a normal weight and have a low risk for diabetes, have this test once every three years after 49 years of age.  If you are overweight and have a high risk for diabetes, consider being tested at a younger age or more often. Preventing infection Hepatitis B  If you have a higher risk for hepatitis B, you should be screened for this virus. You are considered at high risk for hepatitis B if:  You were born in a country where hepatitis B is common. Ask your health care provider which countries are considered high risk.  Your parents were born in a high-risk country, and you have not been immunized against hepatitis B (hepatitis B vaccine).  You have HIV or AIDS.  You use needles to inject street drugs.  You live with someone who has hepatitis B.  You have had sex with someone who has hepatitis B.  You get hemodialysis treatment.  You take certain medicines for conditions, including cancer, organ transplantation, and autoimmune conditions. Hepatitis C  Blood testing is recommended for:  Everyone born from [birth year] through 1963.  Anyone with known risk factors for hepatitis C. Sexually transmitted infections (STIs)  You should be screened for sexually transmitted infections (STIs) including gonorrhea and chlamydia if:  You are sexually active and are younger than 49 years of age.  You are older than 49 years of age and your health care provider tells you that you are at risk for this type of infection.  Your sexual activity has changed since you were last screened and you are at an increased risk for chlamydia or gonorrhea. Ask your health care provider if you are at risk.  If you do not have HIV, but are at risk, it may be recommended that you take a prescription medicine daily to prevent HIV infection. This is called pre-exposure prophylaxis (PrEP). You are considered at  risk if:  You are sexually active and do not regularly use condoms or know the HIV status of your partner(s).  You take drugs by injection.  You are sexually active with a partner who has HIV. Talk with your health care provider about whether you are at high risk of being infected with HIV. If you choose to begin PrEP, you should first be tested for HIV. You should then be tested every 3 months  for as long as you are taking PrEP. Pregnancy  If you are premenopausal and you may become pregnant, ask your health care provider about preconception counseling.  If you may become pregnant, take 400 to 800 micrograms (mcg) of folic acid every day.  If you want to prevent pregnancy, talk to your health care provider about birth control (contraception). Osteoporosis and menopause  Osteoporosis is a disease in which the bones lose minerals and strength with aging. This can result in serious bone fractures. Your risk for osteoporosis can be identified using a bone density scan.  If you are 2 years of age or older, or if you are at risk for osteoporosis and fractures, ask your health care provider if you should be screened.  Ask your health care provider whether you should take a calcium or vitamin D supplement to lower your risk for osteoporosis.  Menopause may have certain physical symptoms and risks.  Hormone replacement therapy may reduce some of these symptoms and risks. Talk to your health care provider about whether hormone replacement therapy is right for you. Follow these instructions at home:  Schedule regular health, dental, and eye exams.  Stay current with your immunizations.  Do not use any tobacco products including cigarettes, chewing tobacco, or electronic cigarettes.  If you are pregnant, do not drink alcohol.  If you are breastfeeding, limit how much and how often you drink alcohol.  Limit alcohol intake to no more than 1 drink per day for nonpregnant women. One drink  equals 12 ounces of beer, 5 ounces of wine, or 1 ounces of hard liquor.  Do not use street drugs.  Do not share needles.  Ask your health care provider for help if you need support or information about quitting drugs.  Tell your health care provider if you often feel depressed.  Tell your health care provider if you have ever been abused or do not feel safe at home. This information is not intended to replace advice given to you by your health care provider. Make sure you discuss any questions you have with your health care provider. Document Released: 09/24/2010 Document Revised: 08/17/2015 Document Reviewed: 12/13/2014 Elsevier Interactive Patient Education  2017 [employer].

## 2016-07-01 NOTE — Progress Notes (Signed)
Pre visit review using our clinic review tool, if applicable. No additional management support is needed unless otherwise documented below in the visit note. 

## 2016-07-01 NOTE — Assessment & Plan Note (Signed)
Checking x-ray today of knees. Advised glucosamine and turmeric as well as exercise to help avoid progression of arthritis.

## 2016-07-04 ENCOUNTER — Ambulatory Visit
Admission: RE | Admit: 2016-07-04 | Discharge: 2016-07-04 | Disposition: A | Discharge status: Home / Self Care | Payer: 59 | Source: Ambulatory Visit | Attending: Internal Medicine | Admitting: Internal Medicine

## 2016-07-04 DIAGNOSIS — E041 Nontoxic single thyroid nodule: Secondary | ICD-10-CM

## 2016-08-16 ENCOUNTER — Ambulatory Visit (INDEPENDENT_AMBULATORY_CARE_PROVIDER_SITE_OTHER): Payer: 59 | Admitting: Internal Medicine

## 2016-08-16 ENCOUNTER — Encounter: Payer: Self-pay | Admitting: Internal Medicine

## 2016-08-16 DIAGNOSIS — M791 Myalgia: Secondary | ICD-10-CM

## 2016-08-16 DIAGNOSIS — M7918 Myalgia, other site: Secondary | ICD-10-CM

## 2016-08-16 NOTE — Progress Notes (Signed)
   Subjective:    Patient ID: Sheila Carter, female    DOB: 1965-03-28, 51 y.o.   MRN: [id number]  HPI The patient is a 51 YO female coming in for pain on her backside. She denies injury to the area. No hemorrhoids or bleeding with bowel movements. No pain with bowel movements as when she had a fissure. She denies any rash or skin lesion. No injury or overuse. No falls. Pain is worse when she is sitting for prolonged time on a hard chair. She is concerned as a friend recently diagnosed with anal cancer and she was worried about this. She has not tried anything for it. Getting up and walking relieves the pain.   Review of Systems  Constitutional: Negative.   Respiratory: Negative.   Cardiovascular: Negative.   Gastrointestinal: Negative for abdominal distention, abdominal pain, blood in stool, constipation, diarrhea, nausea and vomiting.  Musculoskeletal: Positive for myalgias. Negative for arthralgias, back pain, gait problem, joint swelling, neck pain and neck stiffness.  Skin: Negative.   Neurological: Negative.       Objective:   Physical Exam  Constitutional: She is oriented to person, place, and time. She appears well-developed and well-nourished.  HENT:  Head: Normocephalic and atraumatic.  Eyes: EOM are normal.  Neck: Normal range of motion.  Cardiovascular: Normal rate and regular rhythm.   Pulmonary/Chest: Effort normal.  Abdominal: Soft. She exhibits no distension. There is no tenderness. There is no rebound and no guarding.  Genitourinary:  Genitourinary Comments: Exterior anal exam normal without protruding hemorrhoids. No lesions or abscesses or rash on the skin. Left gluteal fold with some tenderness with direct pressure.   Neurological: She is alert and oriented to person, place, and time.  Skin: Skin is warm and dry.   Vitals:   08/16/16 0758  BP: 128/80  Pulse: 91  Resp: 12  Temp: 98.1 F (36.7 C)  TempSrc: Oral  SpO2: 99%  Weight: 166 lb (75.3 kg)  Height:  5\' 6"  (1.676 m)      Assessment & Plan:

## 2016-08-16 NOTE — Assessment & Plan Note (Addendum)
Denies any injuries and only mildly tender on exam. No abscesses appreciated and no hemorrhoids on exam. Advised warm sitz baths and ibuprofen for pain relief. Take frequent walking breaks from sitting. Reassurance given that this is not how anal cancer presents.

## 2016-08-16 NOTE — Patient Instructions (Signed)
We will have you try warm baths and ibuprofen for about 1-2 weeks and if you do not notice any benefit call the office back.

## 2017-05-21 ENCOUNTER — Encounter: Payer: Self-pay | Admitting: Gastroenterology

## 2017-07-28 LAB — HM MAMMOGRAPHY

## 2018-03-09 ENCOUNTER — Encounter: Payer: Self-pay | Admitting: Nurse Practitioner

## 2018-03-09 ENCOUNTER — Ambulatory Visit (INDEPENDENT_AMBULATORY_CARE_PROVIDER_SITE_OTHER): Payer: 59 | Admitting: Nurse Practitioner

## 2018-03-09 ENCOUNTER — Ambulatory Visit: Payer: Self-pay | Admitting: *Deleted

## 2018-03-09 ENCOUNTER — Other Ambulatory Visit (INDEPENDENT_AMBULATORY_CARE_PROVIDER_SITE_OTHER): Payer: 59

## 2018-03-09 ENCOUNTER — Ambulatory Visit (INDEPENDENT_AMBULATORY_CARE_PROVIDER_SITE_OTHER)
Admission: RE | Admit: 2018-03-09 | Discharge: 2018-03-09 | Disposition: A | Discharge status: Home / Self Care | Payer: 59 | Source: Ambulatory Visit | Attending: Nurse Practitioner | Admitting: Nurse Practitioner

## 2018-03-09 VITALS — BP 130/72 | HR 75 | Ht 66.0 in | Wt 146.0 lb

## 2018-03-09 DIAGNOSIS — Z23 Encounter for immunization: Secondary | ICD-10-CM | POA: Diagnosis not present

## 2018-03-09 DIAGNOSIS — R079 Chest pain, unspecified: Secondary | ICD-10-CM

## 2018-03-09 LAB — COMPREHENSIVE METABOLIC PANEL
ALBUMIN: 4.3 g/dL (ref 3.5–5.2)
ALT: 14 U/L (ref 0–35)
AST: 15 U/L (ref 0–37)
Alkaline Phosphatase: 54 U/L (ref 39–117)
BUN: 13 mg/dL (ref 6–23)
CHLORIDE: 106 meq/L (ref 96–112)
CO2: 27 mEq/L (ref 19–32)
Calcium: 9.4 mg/dL (ref 8.4–10.5)
Creatinine, Ser: 0.76 mg/dL (ref 0.40–1.20)
GFR: 85.39 mL/min (ref >60.00)
Glucose, Bld: 96 mg/dL (ref 70–99)
Potassium: 4 mEq/L (ref 3.5–5.1)
SODIUM: 140 meq/L (ref 135–145)
Total Bilirubin: 0.5 mg/dL (ref 0.2–1.2)
Total Protein: 7.6 g/dL (ref 6.0–8.3)

## 2018-03-09 LAB — CBC
HEMATOCRIT: 38.4 % (ref 36.0–46.0)
Hemoglobin: 12.9 g/dL (ref 12.0–15.0)
MCHC: 33.6 g/dL (ref 30.0–36.0)
MCV: 90.5 fl (ref 78.0–100.0)
Platelets: 211 10*3/uL (ref 150.0–400.0)
RBC: 4.24 Mil/uL (ref 3.87–5.11)
RDW: 12.6 % (ref 11.5–15.5)
WBC: 4.7 10*3/uL (ref 4.0–10.5)

## 2018-03-09 MED ORDER — OMEPRAZOLE 40 MG PO CPDR: 40.0000 mg | DELAYED_RELEASE_CAPSULE | Freq: Every day | ORAL | 1 refills | Status: DC

## 2018-03-09 NOTE — Patient Instructions (Addendum)
Head downstairs for labs, chest xray  Start prilosec 40 daily  Stop your birth control and follow up with GYN   Please come back to see Dr Sharlet Salina in 2 weeks for follow up   Chest Wall Pain Chest wall pain is pain in or around the bones and muscles of your chest. Sometimes, an injury causes this pain. Sometimes, the cause may not be known. This pain may take several weeks or longer to get better. Follow these instructions at home: Pay attention to any changes in your symptoms. Take these actions to help with your pain:  Rest as told by your health care provider.  Avoid activities that cause pain. These include any activities that use your chest muscles or your abdominal and side muscles to lift heavy items.  If directed, apply ice to the painful area: ? Put ice in a plastic bag. ? Place a towel between your skin and the bag. ? Leave the ice on for 20 minutes, 2-3 times per day.  Take over-the-counter and prescription medicines only as told by your health care provider.  Do not use tobacco products, including cigarettes, chewing tobacco, and e-cigarettes. If you need help quitting, ask your health care provider.  Keep all follow-up visits as told by your health care provider. This is important.  Contact a health care provider if:  You have a fever.  Your chest pain becomes worse.  You have new symptoms. Get help right away if:  You have nausea or vomiting.  You feel sweaty or light-headed.  You have a cough with phlegm (sputum) or you cough up blood.  You develop shortness of breath. This information is not intended to replace advice given to you by your health care provider. Make sure you discuss any questions you have with your health care provider. Document Released: 03/11/2005 Document Revised: 07/20/2015 Document Reviewed: 06/06/2014 Elsevier Interactive Patient Education  2018 [employer] for Gastroesophageal Reflux Disease, Adult When you  have gastroesophageal reflux disease (GERD), the foods you eat and your eating habits are very important. Choosing the right foods can help ease your discomfort. What guidelines do I need to follow?  Choose fruits, vegetables, whole grains, and low-fat dairy products.  Choose low-fat meat, fish, and poultry.  Limit fats such as oils, salad dressings, butter, nuts, and avocado.  Keep a food diary. This helps you identify foods that cause symptoms.  Avoid foods that cause symptoms. These may be different for everyone.  Eat small meals often instead of 3 large meals a day.  Eat your meals slowly, in a place where you are relaxed.  Limit fried foods.  Cook foods using methods other than frying.  Avoid drinking alcohol.  Avoid drinking large amounts of liquids with your meals.  Avoid bending over or lying down until 2-3 hours after eating. What foods are not recommended? These are some foods and drinks that may make your symptoms worse: Vegetables Tomatoes. Tomato juice. Tomato and spaghetti sauce. Chili peppers. Onion and garlic. Horseradish. Fruits Oranges, grapefruit, and lemon (fruit and juice). Meats High-fat meats, fish, and poultry. This includes hot dogs, ribs, ham, sausage, salami, and bacon. Dairy Whole milk and chocolate milk. Sour cream. Cream. Butter. Ice cream. Cream cheese. Drinks Coffee and tea. Bubbly (carbonated) drinks or energy drinks. Condiments Hot sauce. Barbecue sauce. Sweets/Desserts Chocolate and cocoa. Donuts. Peppermint and spearmint. Fats and Oils High-fat foods. This includes [place] fries and potato chips. Other Vinegar. Strong spices. This includes  black pepper, white pepper, red pepper, cayenne, curry powder, cloves, ginger, and chili powder. The items listed above may not be a complete list of foods and drinks to avoid. Contact your dietitian for more information. This information is not intended to replace advice given to you by your health  care provider. Make sure you discuss any questions you have with your health care provider. Document Released: 09/10/2011 Document Revised: 08/17/2015 Document Reviewed: 01/13/2013 Elsevier Interactive Patient Education  2017 [employer].

## 2018-03-09 NOTE — Progress Notes (Signed)
Sheila Carter is a 52 y.o. female with the following history as recorded in [employer]:  Patient Active Problem List   Diagnosis Date Noted  . Gluteal pain 08/16/2016  . Thyroid nodule 07/01/2016  . Routine general medical examination at a health care facility 07/01/2016  . Chronic pain of both knees 07/01/2016  . Epigastric abdominal pain 01/26/2015    Current Outpatient Medications  Medication Sig Dispense Refill  . lansoprazole (PREVACID) 30 MG capsule Take 30 mg by mouth 2 (two) times daily before a meal.    . omeprazole (PRILOSEC) 40 MG capsule Take 1 capsule (40 mg total) by mouth daily. 30 capsule 1   No current facility-administered medications for this visit.     Allergies: Sulfonamide derivatives  Past Medical History:  Diagnosis Date  . Abnormal pap   . Anal fissure   . Anemia   . Gallbladder disease 2010  . Melanoma ([place]) 2002  . Thyroid nodule     Past Surgical History:  Procedure Laterality Date  . ADENOIDECTOMY  1980  . APPENDECTOMY  1984  . GALLBLADDER SURGERY  2010  . NOSE SURGERY  1992    Family History  Problem Relation Age of Onset  . Heart disease Maternal Grandmother   . Hypothyroidism Mother   . Gallbladder disease Sister        #1  . Nephrolithiasis Father   . Diverticulosis Mother   . Hypertension Mother   . Heart disease Father   . Gallbladder disease Sister        #2  . Diverticulosis Maternal Grandfather   . Hypertension Father   . Hypertension Sister        x 2    Social History   Tobacco Use  . Smoking status: Never Smoker  . Smokeless tobacco: Never Used  Substance Use Topics  . Alcohol use: Yes    Alcohol/week: 0.0 standard drinks    Comment: occ     Subjective:  Sheila Carter is here today for an acute visit, requesting evaluation of c/o "chest pain" which has been occurring for several months now, seeming to get worse since thanksgiving and then this past Friday night noticed the pain felt like it was "in a muscle" in her  left chest. She describes the pain as a "constant pressure on the left side of my chest." She says she has suffered from GERD on and off for years, this reminds her of past gerd sypmtoms, so over the past few weeks she started zantac but ran out, then started tagamet which did not help and then last week started prevacid which seems to help some. She has also noticed some pain in her left arm, with movement only, for about 5-6 weeks now, this arm pain has almost completely resolved now. She is on lo-estrin by gynecology for vaginal dryness. She is not a smoker. She has not been evaluated by cardiology in the past.   Review of Systems  Constitutional: Negative for chills, fever and malaise/fatigue.  Respiratory: Negative for cough and shortness of breath.   Cardiovascular: Positive for chest pain. Negative for palpitations and leg swelling.  Gastrointestinal: Positive for heartburn. Negative for abdominal pain, nausea and vomiting.       Positive for belching, burping  Neurological: Negative for dizziness and weakness.    Objective:  Vitals:   03/09/18 0921  BP: 130/72  Pulse: 75  SpO2: 99%  Weight: 146 lb (66.2 kg)  Height: 5\' 6"  (1.676 m)  General: Well developed, well nourished, in no acute distress  Skin : Warm and dry.  Head: Normocephalic and atraumatic  Eyes: Sclera and conjunctiva clear; pupils round and reactive to light; extraocular movements intact  Oropharynx: Pink, supple. No suspicious lesions  Neck: Supple  Lungs: Respirations unlabored; clear to auscultation bilaterally without wheeze, rales, rhonchi  CVS exam: normal rate, regular rhythm, normal S1, S2..  Abdomen: Soft; nontender; nondistended;  no masses or hepatosplenomegaly  Musculoskeletal: No deformities; no active joint inflammation  Extremities: No edema, cyanosis Vessels: Symmetric bilaterally  Neurologic: Alert and oriented; speech intact; face symmetrical; moves all extremities well; CNII-XII intact  without focal deficit  Psychiatric: Normal mood and affect.  Assessment:  1. Chest pain, unspecified type   2. Need for influenza vaccination     Plan:   1. Need for influenza vaccination - Flu Vaccine QUAD 36+ mos IM  2. Chest pain, unspecified type Suspect some MSK component, This is not a clear picture of GERD, due to age and use of birth control will order additional testing today Start omeprazole course-dosing, side effects discussed Advised her to stop Birth control and F/U with GYN for further management of vaginal symptoms Home management, red flags and return precautions including when to seek immediate/emergency care discussed and printed on AVS F/u in 2 weeks with PCP - check response to PPI - EKG 12-Lead-sinus rhythm, no acute abnormalities - CBC; Future - Comprehensive metabolic panel; Future - D-Dimer, Quantitative; Future - DG Chest 2 View; Future - Ambulatory referral to Cardiology - omeprazole (PRILOSEC) 40 MG capsule; Take 1 capsule (40 mg total) by mouth daily.  Dispense: 30 capsule; Refill: 1   Return in about 2 weeks (around 03/23/2018).  Orders Placed This Encounter  Procedures  . DG Chest 2 View    Standing Status:   Future    Standing Expiration Date:   05/11/2019    Order Specific Question:   Reason for Exam (SYMPTOM  OR DIAGNOSIS REQUIRED)    Answer:   chest pain    Order Specific Question:   Is patient pregnant?    Answer:   No    Order Specific Question:   Preferred imaging location?    Answer:   [facility]    Order Specific Question:   Radiology Contrast Protocol - do NOT remove file path    Answer:   [email or web address]  . Flu Vaccine QUAD 36+ mos IM  . CBC    Standing Status:   Future    Standing Expiration Date:   03/10/2019  . Comprehensive metabolic panel    Standing Status:   Future    Standing Expiration Date:   03/10/2019  . D-Dimer, Quantitative    Standing Status:   Future    Standing  Expiration Date:   03/09/2019  . Ambulatory referral to Cardiology    Referral Priority:   Routine    Referral Type:   Consultation    Referral Reason:   Specialty Services Required    Requested Specialty:   Cardiology    Number of Visits Requested:   1  . EKG 12-Lead    Order Specific Question:   Where should this test be performed?    Answer:   Other    Requested Prescriptions   Signed Prescriptions Disp Refills  . omeprazole (PRILOSEC) 40 MG capsule 30 capsule 1    Sig: Take 1 capsule (40 mg total) by mouth daily.

## 2018-03-09 NOTE — Telephone Encounter (Signed)
C/o chest pain in the left chest. Feels like chest muscle hurt. Feels like acid reflux, burning in her chest/throat. Hx of acid reflux. Started on Friday. Did eat almonds and bananas and the discomfort decreased some. Has been off her acid reflux medications for about a year and just now started taking prevacid again.  Denies sweating, nausea, dizziness, shortness of breath, cough, vomiting or fever.  Per protocol, appointment scheduled. Request appointment for this morning. Will notify [facility] at [facility].  Reason for Disposition . [1] Chest pain lasts > 5 minutes AND [2] occurred > 3 days ago (72 hours) AND [3] NO chest pain or cardiac symptoms now  Answer Assessment - Initial Assessment Questions 1. LOCATION: "Where does it hurt?"       Left chest 2. RADIATION: "Does the pain go anywhere else?" (e.g., into neck, jaw, arms, back)     no 3. ONSET: "When did the chest pain begin?" (Minutes, hours or days)      Friday 4. PATTERN "Does the pain come and go, or has it been constant since it started?"  "Does it get worse with exertion?"      constant 5. DURATION: "How long does it last" (e.g., seconds, minutes, hours)     constant 6. SEVERITY: "How bad is the pain?"  (e.g., Scale 1-10; mild, moderate, or severe)    - MILD (1-3): doesn't interfere with normal activities     - MODERATE (4-7): interferes with normal activities or awakens from sleep    - SEVERE (8-10): excruciating pain, unable to do any normal activities       Pain # 3 7. CARDIAC RISK FACTORS: "Do you have any history of heart problems or risk factors for heart disease?" (e.g., prior heart attack, angina; high blood pressure, diabetes, being overweight, high cholesterol, smoking, or strong family history of heart disease)     Family hx of heart disease, taking b/p pills (52 years of age) 6. PULMONARY RISK FACTORS: "Do you have any history of lung disease?"  (e.g., blood clots in lung, asthma, emphysema, birth control pills)     Birth  control pills started 2 months ago 9. CAUSE: "What do you think is causing the chest pain?"     Thinks it is reflux 10. OTHER SYMPTOMS: "Do you have any other symptoms?" (e.g., dizziness, nausea, vomiting, sweating, fever, difficulty breathing, cough)       no 11. PREGNANCY: "Is there any chance you are pregnant?" "When was your last menstrual period?"       LMP right now, menopausal now  Protocols used: CHEST PAIN-A-AH

## 2018-03-10 LAB — D-DIMER, QUANTITATIVE (NOT AT ARMC): D DIMER QUANT: 0.27 ug{FEU}/mL (ref <0.50)

## 2018-03-24 ENCOUNTER — Ambulatory Visit (INDEPENDENT_AMBULATORY_CARE_PROVIDER_SITE_OTHER): Payer: 59 | Admitting: Internal Medicine

## 2018-03-24 ENCOUNTER — Encounter: Payer: Self-pay | Admitting: Internal Medicine

## 2018-03-24 VITALS — BP 130/80 | HR 93 | Temp 97.8°F | Ht 66.0 in | Wt 145.0 lb

## 2018-03-24 DIAGNOSIS — K219 Gastro-esophageal reflux disease without esophagitis: Secondary | ICD-10-CM

## 2018-03-24 DIAGNOSIS — Z23 Encounter for immunization: Secondary | ICD-10-CM

## 2018-03-24 DIAGNOSIS — R079 Chest pain, unspecified: Secondary | ICD-10-CM | POA: Diagnosis not present

## 2018-03-24 MED ORDER — OMEPRAZOLE 40 MG PO CPDR: 40.0000 mg | DELAYED_RELEASE_CAPSULE | Freq: Two times a day (BID) | ORAL | 1 refills | Status: DC

## 2018-03-24 NOTE — Progress Notes (Signed)
   Subjective:   Patient ID: Sheila Carter, female    DOB: 10/03/1965, 52 y.o.   MRN: [id number]  HPI The patient is a 52 YO female coming in for follow up chest pains. She was seen and got EKG which was normal and labs to rule out ACS and DVT/PE which were negative. She was given GERD medication and told it was likely that or muscle in chest. It had been going on for almost 1 month. Now it is improving some. She feels that the heartburn medicine is not strong enough. She is having some mild heartburn symptoms. She also has recalled that prior to onset she was doing a lot of raking and leaf blowing which may have contributed and no longer tender to touch on chest wall.   Review of Systems  Constitutional: Negative.   HENT: Negative.   Eyes: Negative.   Respiratory: Negative for cough, chest tightness and shortness of breath.   Cardiovascular: Negative for chest pain, palpitations and leg swelling.  Gastrointestinal: Negative for abdominal distention, abdominal pain, constipation, diarrhea, nausea and vomiting.       GERD  Musculoskeletal: Negative.   Skin: Negative.   Neurological: Negative.   Psychiatric/Behavioral: Negative.     Objective:  Physical Exam Constitutional:      Appearance: She is well-developed.  HENT:     Head: Normocephalic and atraumatic.  Neck:     Musculoskeletal: Normal range of motion.  Cardiovascular:     Rate and Rhythm: Normal rate and regular rhythm.  Pulmonary:     Effort: Pulmonary effort is normal. No respiratory distress.     Breath sounds: Normal breath sounds. No wheezing or rales.  Abdominal:     General: Bowel sounds are normal. There is no distension.     Palpations: Abdomen is soft.     Tenderness: There is no abdominal tenderness. There is no rebound.  Skin:    General: Skin is warm and dry.  Neurological:     Mental Status: She is alert and oriented to person, place, and time.     Coordination: Coordination normal.     Vitals:   03/24/18 0854  BP: 130/80  Pulse: 93  Temp: 97.8 F (36.6 C)  TempSrc: Oral  SpO2: 98%  Weight: 145 lb (65.8 kg)  Height: 5\' 6"  (1.676 m)    Assessment & Plan:  Tdap given at visit

## 2018-03-24 NOTE — Assessment & Plan Note (Signed)
Increase omeprazole to BID for 1-2 weeks then back to daily. Talked to her about colon cancer screening and due to some uncertainty with insurance she wishes to defer until she knows what will happen with this.

## 2018-03-24 NOTE — Patient Instructions (Addendum)
We will have you take the omeprazole twice a day for 1 week then go back down to once a day.    Shoulder Exercises Ask your health care provider which exercises are safe for you. Do exercises exactly as told by your health care provider and adjust them as directed. It is normal to feel mild stretching, pulling, tightness, or discomfort as you do these exercises, but you should stop right away if you feel sudden pain or your pain gets worse.Do not begin these exercises until told by your health care provider. Range of Motion Exercises   These exercises warm up your muscles and joints and improve the movement and flexibility of your shoulder. These exercises also help to relieve pain, numbness, and tingling. These exercises involve stretching your injured shoulder directly. Exercise A: Pendulum 1. Stand near a wall or a surface that you can hold onto for balance. 2. Bend at the waist and let your left / right arm hang straight down. Use your other arm to support you. Keep your back straight and do not lock your knees. 3. Relax your left / right arm and shoulder muscles, and move your hips and your trunk so your left / right arm swings freely. Your arm should swing because of the motion of your body, not because you are using your arm or shoulder muscles. 4. Keep moving your body so your arm swings in the following directions, as told by your health care provider: ? Side to side. ? Forward and backward. ? In clockwise and counterclockwise circles. 5. Continue each motion for __________ seconds, or for as long as told by your health care provider. 6. Slowly return to the starting position. Repeat __________ times. Complete this exercise __________ times a day. Exercise B:Flexion, Standing 1. Stand and hold a broomstick, a cane, or a similar object. Place your hands a little more than shoulder-width apart on the object. Your left / right hand should be palm-up, and your other hand should be  palm-down. 2. Keep your elbow straight and keep your shoulder muscles relaxed. Push the stick down with your healthy arm to raise your left / right arm in front of your body, and then over your head until you feel a stretch in your shoulder. ? Avoid shrugging your shoulder while you raise your arm. Keep your shoulder blade tucked down toward the middle of your back. 3. Hold for __________ seconds. 4. Slowly return to the starting position. Repeat __________ times. Complete this exercise __________ times a day. Exercise C: Abduction, Standing 1. Stand and hold a broomstick, a cane, or a similar object. Place your hands a little more than shoulder-width apart on the object. Your left / right hand should be palm-up, and your other hand should be palm-down. 2. While keeping your elbow straight and your shoulder muscles relaxed, push the stick across your body toward your left / right side. Raise your left / right arm to the side of your body and then over your head until you feel a stretch in your shoulder. ? Do not raise your arm above shoulder height, unless your health care provider tells you to do that. ? Avoid shrugging your shoulder while you raise your arm. Keep your shoulder blade tucked down toward the middle of your back. 3. Hold for __________ seconds. 4. Slowly return to the starting position. Repeat __________ times. Complete this exercise __________ times a day. Exercise D:Internal Rotation 1. Place your left / right hand behind your back, palm-up.  2. Use your other hand to dangle an exercise band, a towel, or a similar object over your shoulder. Grasp the band with your left / right hand so you are holding onto both ends. 3. Gently pull up on the band until you feel a stretch in the front of your left / right shoulder. ? Avoid shrugging your shoulder while you raise your arm. Keep your shoulder blade tucked down toward the middle of your back. 4. Hold for __________  seconds. 5. Release the stretch by letting go of the band and lowering your hands. Repeat __________ times. Complete this exercise __________ times a day. Stretching Exercises  These exercises warm up your muscles and joints and improve the movement and flexibility of your shoulder. These exercises also help to relieve pain, numbness, and tingling. These exercises are done using your healthy shoulder to help stretch the muscles of your injured shoulder. Exercise E: Warehouse manager (External Rotation and Abduction) 1. Stand in a doorway with one of your feet slightly in front of the other. This is called a staggered stance. If you cannot reach your forearms to the door frame, stand facing a corner of a room. 2. Choose one of the following positions as told by your health care provider: ? Place your hands and forearms on the door frame above your head. ? Place your hands and forearms on the door frame at the height of your head. ? Place your hands on the door frame at the height of your elbows. 3. Slowly move your weight onto your front foot until you feel a stretch across your chest and in the front of your shoulders. Keep your head and chest upright and keep your abdominal muscles tight. 4. Hold for __________ seconds. 5. To release the stretch, shift your weight to your back foot. Repeat __________ times. Complete this stretch __________ times a day. Exercise F:Extension, Standing 1. Stand and hold a broomstick, a cane, or a similar object behind your back. ? Your hands should be a little wider than shoulder-width apart. ? Your palms should face away from your back. 2. Keeping your elbows straight and keeping your shoulder muscles relaxed, move the stick away from your body until you feel a stretch in your shoulder. ? Avoid shrugging your shoulders while you move the stick. Keep your shoulder blade tucked down toward the middle of your back. 3. Hold for __________ seconds. 4. Slowly return to  the starting position. Repeat __________ times. Complete this exercise __________ times a day. Strengthening Exercises           These exercises build strength and endurance in your shoulder. Endurance is the ability to use your muscles for a long time, even after they get tired. Exercise G:External Rotation 1. Sit in a stable chair without armrests. 2. Secure an exercise band at elbow height on your left / right side. 3. Place a soft object, such as a folded towel or a small pillow, between your left / right upper arm and your body to move your elbow a few inches away (about 10 cm) from your side. 4. Hold the end of the band so it is tight and there is no slack. 5. Keeping your elbow pressed against the soft object, move your left / right forearm out, away from your abdomen. Keep your body steady so only your forearm moves. 6. Hold for __________ seconds. 7. Slowly return to the starting position. Repeat __________ times. Complete this exercise __________ times a day. Exercise  H:Shoulder Abduction 1. Sit in a stable chair without armrests, or stand. 2. Hold a __________ weight in your left / right hand, or hold an exercise band with both hands. 3. Start with your arms straight down and your left / right palm facing in, toward your body. 4. Slowly lift your left / right hand out to your side. Do not lift your hand above shoulder height unless your health care provider tells you that this is safe. ? Keep your arms straight. ? Avoid shrugging your shoulder while you do this movement. Keep your shoulder blade tucked down toward the middle of your back. 5. Hold for __________ seconds. 6. Slowly lower your arm, and return to the starting position. Repeat __________ times. Complete this exercise __________ times a day. Exercise I:Shoulder Extension 1. Sit in a stable chair without armrests, or stand. 2. Secure an exercise band to a stable object in front of you where it is at shoulder  height. 3. Hold one end of the exercise band in each hand. Your palms should face each other. 4. Straighten your elbows and lift your hands up to shoulder height. 5. Step back, away from the secured end of the exercise band, until the band is tight and there is no slack. 6. Squeeze your shoulder blades together as you pull your hands down to the sides of your thighs. Stop when your hands are straight down by your sides. Do not let your hands go behind your body. 7. Hold for __________ seconds. 8. Slowly return to the starting position. Repeat __________ times. Complete this exercise __________ times a day. Exercise J:Standing Shoulder Row 1. Sit in a stable chair without armrests, or stand. 2. Secure an exercise band to a stable object in front of you so it is at waist height. 3. Hold one end of the exercise band in each hand. Your palms should be in a thumbs-up position. 4. Bend each of your elbows to an "L" shape (about 90 degrees) and keep your upper arms at your sides. 5. Step back until the band is tight and there is no slack. 6. Slowly pull your elbows back behind you. 7. Hold for __________ seconds. 8. Slowly return to the starting position. Repeat __________ times. Complete this exercise __________ times a day. Exercise K:Shoulder Press-Ups 1. Sit in a stable chair that has armrests. Sit upright, with your feet flat on the floor. 2. Put your hands on the armrests so your elbows are bent and your fingers are pointing forward. Your hands should be about even with the sides of your body. 3. Push down on the armrests and use your arms to lift yourself off of the chair. Straighten your elbows and lift yourself up as much as you comfortably can. ? Move your shoulder blades down, and avoid letting your shoulders move up toward your ears. ? Keep your feet on the ground. As you get stronger, your feet should support less of your body weight as you lift yourself up. 4. Hold for __________  seconds. 5. Slowly lower yourself back into the chair. Repeat __________ times. Complete this exercise __________ times a day. Exercise L: Wall Push-Ups 1. Stand so you are facing a stable wall. Your feet should be about one arm-length away from the wall. 2. Lean forward and place your palms on the wall at shoulder height. 3. Keep your feet flat on the floor as you bend your elbows and lean forward toward the wall. 4. Hold for __________ seconds. 5.  Straighten your elbows to push yourself back to the starting position. Repeat __________ times. Complete this exercise __________ times a day. This information is not intended to replace advice given to you by your health care provider. Make sure you discuss any questions you have with your health care provider. Document Released: 01/23/2005 Document Revised: 07/15/2017 Document Reviewed: 11/20/2014 Elsevier Interactive Patient Education  2019 [employer].

## 2018-07-20 ENCOUNTER — Telehealth: Payer: 59 | Admitting: Physician Assistant

## 2018-07-20 DIAGNOSIS — R12 Heartburn: Secondary | ICD-10-CM

## 2018-07-20 MED ORDER — OMEPRAZOLE 20 MG PO CPDR: 20.0000 mg | DELAYED_RELEASE_CAPSULE | Freq: Every day | ORAL | 3 refills | Status: DC

## 2018-07-20 NOTE — Progress Notes (Signed)
We are sorry that you are not feeling well.  Here is how we plan to help!  Based on what you shared with me it looks like you most likely have Gastroesophageal Reflux Disease (GERD)  Gastroesophageal reflux disease (GERD) happens when acid from your stomach flows up into the esophagus.  When acid comes in contact with the esophagus, the acid causes sorenss (inflammation) in the esophagus.  Over time, GERD may create small holes (ulcers) in the lining of the esophagus.  I have prescribed Omeprazole, a Protein Pump inhibitor, 20 mg daily until you follow up with a provider.   Please contact GI if this is providing no relief.    Your symptoms should improve in the next day or two.  You can use antacids as needed until symptoms resolve.  Call us if your heartburn worsens, you have trouble swallowing, weight loss, spitting up blood or recurrent vomiting.  Home Care:  May include lifestyle changes such as weight loss, quitting smoking and alcohol consumption  Avoid foods and drinks that make your symptoms worse, such as:  Caffeine or alcoholic drinks  Chocolate  Peppermint or mint flavorings  Garlic and onions  Spicy foods  Citrus fruits, such as oranges, lemons, or limes  Tomato-based foods such as sauce, chili, salsa and pizza  Fried and fatty foods  Avoid lying down for 3 hours prior to your bedtime or prior to taking a nap  Eat small, frequent meals instead of a large meals  Wear loose-fitting clothing.  Do not wear anything tight around your waist that causes pressure on your stomach.  Raise the head of your bed 6 to 8 inches with wood blocks to help you sleep.  Extra pillows will not help.  Seek Help Right Away If:  You have pain in your arms, neck, jaw, teeth or back  Your pain increases or changes in intensity or duration  You develop nausea, vomiting or sweating (diaphoresis)  You develop shortness of breath or you faint  Your vomit is green, yellow, black or  looks like coffee grounds or blood  Your stool is red, bloody or black  These symptoms could be signs of other problems, such as heart disease, gastric bleeding or esophageal bleeding.  Make sure you :  Understand these instructions.  Will watch your condition.  Will get help right away if you are not doing well or get worse.  Your e-visit answers were reviewed by a board certified advanced clinical practitioner to complete your personal care plan.  Depending on the condition, your plan could have included both over the counter or prescription medications.  If there is a problem please reply  once you have received a response from your provider.  Your safety is important to [place].  If you have drug allergies check your prescription carefully.    You can use MyChart to ask questions about today's visit, request a non-urgent call back, or ask for a work or school excuse for 24 hours related to this e-Visit. If it has been greater than 24 hours you will need to follow up with your provider, or enter a new e-Visit to address those concerns.  You will get an e-mail in the next two days asking about your experience.  I hope that your e-visit has been valuable and will speed your recovery. Thank you for using e-visits.  Approximately 5 minutes spent documenting and reviewing patients chart.  Konrad Felix, PA-C

## 2018-11-03 ENCOUNTER — Other Ambulatory Visit: Payer: Self-pay

## 2018-11-03 ENCOUNTER — Encounter: Payer: Self-pay | Admitting: Internal Medicine

## 2018-11-03 ENCOUNTER — Ambulatory Visit (INDEPENDENT_AMBULATORY_CARE_PROVIDER_SITE_OTHER): Payer: 59 | Admitting: Internal Medicine

## 2018-11-03 VITALS — BP 120/90 | HR 82 | Temp 98.2°F | Ht 66.0 in | Wt 149.0 lb

## 2018-11-03 DIAGNOSIS — M25572 Pain in left ankle and joints of left foot: Secondary | ICD-10-CM | POA: Diagnosis not present

## 2018-11-03 DIAGNOSIS — Z0000 Encounter for general adult medical examination without abnormal findings: Secondary | ICD-10-CM

## 2018-11-03 MED ORDER — DICLOFENAC SODIUM 1 % TD GEL: 4.0000 g | Freq: Four times a day (QID) | TRANSDERMAL | 6 refills | Status: DC

## 2018-11-03 NOTE — Patient Instructions (Signed)
We have sent in the voltaren gel to use for pain if needed.    Iliotibial Rehab Ask your health care provider which exercises are safe for you. Do exercises exactly as told by your health care provider and adjust them as directed. It is normal to feel mild stretching, pulling, tightness, or discomfort as you do these exercises. Stop right away if you feel sudden pain or your pain gets worse. Do not begin these exercises until told by your health care provider. Stretching and range-of-motion exercises These exercises warm up your muscles and joints and improve the movement and flexibility of your leg. These exercises also help to relieve pain and stiffness. Quadriceps stretch, prone  1. Lie on your abdomen (prone position) on a firm surface, such as a bed or padded floor. 2. Bend your left / right knee and reach back to hold your ankle or pant leg. If you cannot reach your ankle or pant leg, loop a belt around your foot and grab the belt instead. 3. Gently pull your heel toward your buttocks. Your knee should not slide out to the side. You should feel a stretch in the front of your thigh and knee (quadriceps). 4. Hold this position for __________ seconds. Repeat __________ times. Complete this exercise __________ times a day. Lunge This exercise stretches the muscle in the inner thigh (adductor). 1. Stand and spread your legs about 3 ft (1 m) apart. Put your left / right leg slightly back for balance. 2. Lean away from your left / right leg by bending your other knee and shifting your weight toward your bent knee. You may rest your hands on your thigh for balance. You should feel a stretch in your left / right inner thigh. 3. Hold this position for __________ seconds. Repeat __________ times. Complete this exercise __________ times a day. Hamstring stretch, supine  1. Lie on your back (supine position). 2. Loop a belt or towel over the ball of your left / right foot. The ball of your foot is on  the walking surface, right under your toes. 3. Straighten your left / right knee and slowly pull on the belt or towel to raise your leg. Stop when you feel a gentle stretch in the back of your left / right knee or thigh (hamstrings). ? Do not let your left / right knee bend. ? Keep your other leg flat on the floor. 4. Hold this position for __________ seconds. Repeat __________ times. Complete this exercise __________ times a day. Strengthening exercises These exercises build strength and endurance in your leg. Endurance is the ability to use your muscles for a long time, even after they get tired. Wall slides This exercise strengthens the muscles in the front of your thigh and knee (quadriceps). 1. Lean your back against a smooth wall or door, and walk your feet out 18-24 inches (46-61 cm) from it. 2. Place your feet hip-width apart. 3. Slowly slide down the wall or door until your knees bend as far as told by your health care provider. Keep your knees over your heels, not your toes. Keep your knees in line with your hips. 4. Hold this position for __________ seconds. 5. Use the muscles in the front of your thigh to push yourself up to the standing position. 6. Rest for __________ seconds after each repetition. Repeat __________ times. Complete this exercise __________ times a day. Straight leg raises, side-lying This exercise is sometimes called a hip abductor exercise. It strengthens the muscles  that rotate the leg at the hip and move it away from your body (hip abductors). 1. Lie on your side with your left / right leg in the top position. Lie so your head, shoulder, hip, and knee line up. Bend your bottom knee slightly to help you balance. 2. Lift your top leg 4-6 inches (10-15 cm) while keeping your toes pointed straight ahead. 3. Hold this position for __________ seconds. 4. Slowly lower your leg to the starting position. 5. Let your muscles relax completely after each  repetition. Repeat __________ times. Complete this exercise __________ times a day. Straight leg raises, prone This exercise strengthens the muscles that move the hips (hip extensors). 1. Lie on your abdomen (prone position) on a firm surface. You can put a pillow under your hips if that is more comfortable for your lower back. 2. Squeeze your buttocks muscles and lift your left / right leg about 4-6 inches (10-15 cm). Keep your knee straight as you lift your leg. 3. Hold this position for __________ seconds. 4. Slowly lower your leg to the starting position. 5. Let your muscles relax completely after each repetition. Repeat __________ times. Complete this exercise __________ times a day. Bridge This exercise strengthens the muscles that move the hips (hip extensors). 1. Lie on your back on a firm surface with your knees bent and your feet flat on the floor. 2. Tighten your buttocks muscles and lift your bottom off the floor until the trunk of your body is level with your thighs. ? Do not arch your back. ? You should feel the muscles working in your buttocks and the back of your thighs. If you do not feel these muscles, slide your feet 1-2 inches (2.5-5 cm) farther away from your buttocks. 3. Hold this position for __________ seconds. 4. Slowly lower your hips to the starting position. 5. Let your muscles relax completely after each repetition. 6. If this exercise is too easy, try doing it with your arms crossed over your chest. Repeat __________ times. Complete this exercise __________ times a day. This information is not intended to replace advice given to you by your health care provider. Make sure you discuss any questions you have with your health care provider. Document Released: 03/11/2005 Document Revised: 07/02/2018 Document Reviewed: 05/18/2018 Elsevier Patient Education  [employer].

## 2018-11-03 NOTE — Progress Notes (Signed)
   Subjective:   Patient ID: Sheila Carter, female    DOB: [birth year], 53 y.o.   MRN: [id number]  HPI The patient is a 53 YO female coming in for ankle pain and potential nerve pain. She did fall about 8 week ago and injured left ankle and broken right ankle. She was in boot for some time and is now mobile again. She is still having some swelling in the right ankle. She is seeing orthopedics for this but they were unavailable this week. She is having some sensitivity on the skin of the left foot to laces of shoes and pressure. She is also getting some discomfort above the left knee. She is taking nothing for this currently. Was taking meloxicam from ortho for a couple of weeks but this bothered her stomach and she has stopped. Denies fevers or chills. Denies significant change in pain or swelling.   Review of Systems  Constitutional: Negative.   HENT: Negative.   Eyes: Negative.   Respiratory: Negative for cough, chest tightness and shortness of breath.   Cardiovascular: Negative for chest pain, palpitations and leg swelling.  Gastrointestinal: Negative for abdominal distention, abdominal pain, constipation, diarrhea, nausea and vomiting.  Musculoskeletal: Positive for arthralgias, joint swelling and myalgias.  Skin: Negative.   Neurological: Negative.   Psychiatric/Behavioral: Negative.     Objective:  Physical Exam Constitutional:      Appearance: She is well-developed.  HENT:     Head: Normocephalic and atraumatic.  Neck:     Musculoskeletal: Normal range of motion.  Cardiovascular:     Rate and Rhythm: Normal rate and regular rhythm.  Pulmonary:     Effort: Pulmonary effort is normal. No respiratory distress.     Breath sounds: Normal breath sounds. No wheezing or rales.  Abdominal:     General: Bowel sounds are normal. There is no distension.     Palpations: Abdomen is soft.     Tenderness: There is no abdominal tenderness. There is no rebound.  Musculoskeletal:         General: Swelling and tenderness present.     Comments: Right ankle with swelling and some pain with direct palpation, left ankle with sensitivity to the surface and some pain superior to the left knee without swelling or skin color changes  Skin:    General: Skin is warm and dry.  Neurological:     Mental Status: She is alert and oriented to person, place, and time.     Coordination: Coordination normal.     Vitals:   11/03/18 1458  BP: 120/90  Pulse: 82  Temp: 98.2 F (36.8 C)  TempSrc: Oral  SpO2: 98%  Weight: 149 lb (67.6 kg)  Height: 5\' 6"  (1.676 m)    Assessment & Plan:  Visit time 25 minutes: greater than 50% of that time was spent in face to face counseling and coordination of care with the patient: counseled about recent course and events as we do not have records from x-ray etc we need to discuss based on patient recall and advise on treatment options and likely course

## 2018-11-04 ENCOUNTER — Encounter: Payer: Self-pay | Admitting: Internal Medicine

## 2018-11-04 DIAGNOSIS — M25572 Pain in left ankle and joints of left foot: Secondary | ICD-10-CM | POA: Insufficient documentation

## 2018-11-04 NOTE — Progress Notes (Unsigned)
Abstracted and sent to scan  

## 2018-11-04 NOTE — Assessment & Plan Note (Signed)
Suspect from sprain. Unclear what a bone chip in the bone means so will get records from orthopedics. We talked about how this could be an irritant to her nerves. Rx voltaren gel to use topically. Suspect the left knee pain is tightness in the tendons due to inactivity for weeks due to fracture right ankle. Given stretching information to work on that.

## 2018-11-11 ENCOUNTER — Encounter: Payer: Self-pay | Admitting: Gastroenterology

## 2018-12-09 ENCOUNTER — Ambulatory Visit (AMBULATORY_SURGERY_CENTER): Payer: Self-pay | Admitting: *Deleted

## 2018-12-09 ENCOUNTER — Other Ambulatory Visit: Payer: Self-pay

## 2018-12-09 VITALS — Temp 96.9°F | Ht 66.5 in | Wt 151.0 lb

## 2018-12-09 DIAGNOSIS — Z1211 Encounter for screening for malignant neoplasm of colon: Secondary | ICD-10-CM

## 2018-12-09 MED ORDER — PEG 3350-KCL-NA BICARB-NACL 420 G PO SOLR: 4000.0000 mL | Freq: Once | ORAL | 0 refills | Status: AC

## 2018-12-09 NOTE — Progress Notes (Signed)
No egg or soy allergy known to patient  No issues with past sedation with any surgeries  or procedures, no intubation problems  No diet pills per patient No home 02 use per patient  No blood thinners per patient  Pt rare issues with constipation , increases water and vegetable intake No A fib or A flutter  EMMI video offered and declined

## 2018-12-11 ENCOUNTER — Encounter: Payer: Self-pay | Admitting: Gastroenterology

## 2018-12-12 LAB — HM MAMMOGRAPHY

## 2018-12-15 ENCOUNTER — Encounter: Payer: Self-pay | Admitting: Internal Medicine

## 2018-12-15 NOTE — Progress Notes (Signed)
Abstracted and sent to scan  

## 2018-12-22 ENCOUNTER — Telehealth: Payer: Self-pay

## 2018-12-22 NOTE — Telephone Encounter (Signed)
Covid-19 screening questions   Do you now or have you had a fever in the last 14 days?  Do you have any respiratory symptoms of shortness of breath or cough now or in the last 14 days?  Do you have any family members or close contacts with diagnosed or suspected Covid-19 in the past 14 days?  Have you been tested for Covid-19 and found to be positive?       

## 2018-12-23 ENCOUNTER — Ambulatory Visit (AMBULATORY_SURGERY_CENTER): Payer: 59 | Admitting: Gastroenterology

## 2018-12-23 ENCOUNTER — Encounter: Payer: Self-pay | Admitting: Gastroenterology

## 2018-12-23 ENCOUNTER — Other Ambulatory Visit: Payer: Self-pay

## 2018-12-23 VITALS — BP 115/68 | HR 83 | Temp 97.9°F | Resp 13 | Ht 66.0 in | Wt 151.0 lb

## 2018-12-23 DIAGNOSIS — K635 Polyp of colon: Secondary | ICD-10-CM | POA: Diagnosis not present

## 2018-12-23 DIAGNOSIS — D124 Benign neoplasm of descending colon: Secondary | ICD-10-CM

## 2018-12-23 DIAGNOSIS — Z1211 Encounter for screening for malignant neoplasm of colon: Secondary | ICD-10-CM

## 2018-12-23 MED ORDER — SODIUM CHLORIDE 0.9 % IV SOLN: 500.0000 mL | Freq: Once | INTRAVENOUS | Status: DC

## 2018-12-23 NOTE — Op Note (Signed)
[facility] Patient Name: Sheila Carter Procedure Date: 12/23/2018 9:24 AM MRN: [id number] Endoscopist: Milus Banister , MD Age: 53 Referring MD:  Date of Birth: 12-14-1965 Gender: Female Account #: [id number] Procedure:                Colonoscopy Indications:              Screening for colorectal malignant neoplasm Medicines:                Monitored Anesthesia Care Procedure:                Pre-Anesthesia Assessment:                           - Prior to the procedure, a History and Physical                            was performed, and patient medications and                            allergies were reviewed. The patient's tolerance of                            previous anesthesia was also reviewed. The risks                            and benefits of the procedure and the sedation                            options and risks were discussed with the patient.                            All questions were answered, and informed consent                            was obtained. Prior Anticoagulants: The patient has                            taken no previous anticoagulant or antiplatelet                            agents. ASA Grade Assessment: II - A patient with                            mild systemic disease. After reviewing the risks                            and benefits, the patient was deemed in                            satisfactory condition to undergo the procedure.                           After obtaining informed consent, the colonoscope  was passed under direct vision. Throughout the                            procedure, the patient's blood pressure, pulse, and                            oxygen saturations were monitored continuously. The                            Colonoscope was introduced through the anus and                            advanced to the the cecum, identified by                            appendiceal orifice and  ileocecal valve. The                            colonoscopy was performed without difficulty. The                            patient tolerated the procedure well. The quality                            of the bowel preparation was good. The ileocecal                            valve, appendiceal orifice, and rectum were                            photographed. Scope In: 9:38:24 AM Scope Out: 9:51:59 AM Scope Withdrawal Time: 0 hours 9 minutes 10 seconds  Total Procedure Duration: 0 hours 13 minutes 35 seconds  Findings:                 A 2 mm polyp was found in the descending colon. The                            polyp was sessile. The polyp was removed with a                            cold snare. Resection and retrieval were complete.                           Internal hemorrhoids were found. The hemorrhoids                            were small.                           The exam was otherwise without abnormality on                            direct and retroflexion views. Complications:  No immediate complications. Estimated blood loss:                            None. Estimated Blood Loss:     Estimated blood loss: none. Impression:               - One 2 mm polyp in the descending colon, removed                            with a cold snare. Resected and retrieved.                           - Internal hemorrhoids.                           - The examination was otherwise normal on direct                            and retroflexion views. Recommendation:           - Patient has a contact number available for                            emergencies. The signs and symptoms of potential                            delayed complications were discussed with the                            patient. Return to normal activities tomorrow.                            Written discharge instructions were provided to the                            patient.                           -  Resume previous diet.                           - Continue present medications.                           - Await pathology results. Milus Banister, MD 12/23/2018 9:54:10 AM This report has been signed electronically.

## 2018-12-23 NOTE — Progress Notes (Signed)
Called to room to assist during endoscopic procedure.  Patient ID and intended procedure confirmed with present staff. Received instructions for my participation in the procedure from the performing physician.  

## 2018-12-23 NOTE — Progress Notes (Signed)
Report given to PACU, vss 

## 2018-12-23 NOTE — Patient Instructions (Signed)
Thank you for allowing us to care for you today!  Await pathology results by mail, approximately 2 weeks.  Recommendation for next colonoscopy will be made at that time.  Resume previous diet and medications today.  Return to your normal activities tomorrow.      YOU HAD AN ENDOSCOPIC PROCEDURE TODAY AT THE [facility]:   Refer to the procedure report that was given to you for any specific questions about what was found during the examination.  If the procedure report does not answer your questions, please call your gastroenterologist to clarify.  If you requested that your care partner not be given the details of your procedure findings, then the procedure report has been included in a sealed envelope for you to review at your convenience later.  YOU SHOULD EXPECT: Some feelings of bloating in the abdomen. Passage of more gas than usual.  Walking can help get rid of the air that was put into your GI tract during the procedure and reduce the bloating. If you had a lower endoscopy (such as a colonoscopy or flexible sigmoidoscopy) you may notice spotting of blood in your stool or on the toilet paper. If you underwent a bowel prep for your procedure, you may not have a normal bowel movement for a few days.  Please Note:  You might notice some irritation and congestion in your nose or some drainage.  This is from the oxygen used during your procedure.  There is no need for concern and it should clear up in a day or so.  SYMPTOMS TO REPORT IMMEDIATELY:   Following lower endoscopy (colonoscopy or flexible sigmoidoscopy):  Excessive amounts of blood in the stool  Significant tenderness or worsening of abdominal pains  Swelling of the abdomen that is new, acute  Fever of 100F or higher    For urgent or emergent issues, a gastroenterologist can be reached at any hour by calling [phone].   DIET:  We do recommend a small meal at first, but then you may proceed to your regular  diet.  Drink plenty of fluids but you should avoid alcoholic beverages for 24 hours.  ACTIVITY:  You should plan to take it easy for the rest of today and you should NOT DRIVE or use heavy machinery until tomorrow (because of the sedation medicines used during the test).    FOLLOW UP: Our staff will call the number listed on your records 48-72 hours following your procedure to check on you and address any questions or concerns that you may have regarding the information given to you following your procedure. If we do not reach you, we will leave a message.  We will attempt to reach you two times.  During this call, we will ask if you have developed any symptoms of COVID 19. If you develop any symptoms (ie: fever, flu-like symptoms, shortness of breath, cough etc.) before then, please call [phone].  If you test positive for Covid 19 in the 2 weeks post procedure, please call and report this information to us.    If any biopsies were taken you will be contacted by phone or by letter within the next 1-3 weeks.  Please call us at [phone] if you have not heard about the biopsies in 3 weeks.    SIGNATURES/CONFIDENTIALITY: You and/or your care partner have signed paperwork which will be entered into your electronic medical record.  These signatures attest to the fact that that the information above on your After Visit   been reviewed and is understood.  Full responsibility of the confidentiality of this discharge information lies with you and/or your care-partner.

## 2018-12-23 NOTE — Progress Notes (Signed)
Pt's states no medical or surgical changes since previsit or office visit.  [place]

## 2018-12-25 ENCOUNTER — Telehealth: Payer: Self-pay | Admitting: *Deleted

## 2018-12-25 ENCOUNTER — Telehealth: Payer: Self-pay

## 2018-12-25 NOTE — Telephone Encounter (Signed)
  Follow up Call-  Call back number 12/23/2018  Post procedure Call Back phone  # [phone]  Permission to leave phone message Yes  Some recent data might be hidden     Patient questions:  Do you have a fever, pain , or abdominal swelling? No. Pain Score  0 *  Have you tolerated food without any problems? Yes.    Have you been able to return to your normal activities? Yes.    Do you have any questions about your discharge instructions: Diet   No. Medications  No. Follow up visit  No.  Do you have questions or concerns about your Care? No.  Actions: * If pain score is 4 or above: No action needed, pain <4. 1. Have you developed a fever since your procedure? no  2.   Have you had an respiratory symptoms (SOB or cough) since your procedure? no  3.   Have you tested positive for COVID 19 since your procedure no  4.   Have you had any family members/close contacts diagnosed with the COVID 19 since your procedure?  no   If yes to any of these questions please route to Joylene John, RN and Alphonsa Gin, Therapist, sports.

## 2018-12-25 NOTE — Telephone Encounter (Signed)
First attempt, left VM.  

## 2018-12-28 ENCOUNTER — Encounter: Payer: Self-pay | Admitting: Gastroenterology

## 2019-02-17 ENCOUNTER — Ambulatory Visit (INDEPENDENT_AMBULATORY_CARE_PROVIDER_SITE_OTHER)
Admission: RE | Admit: 2019-02-17 | Discharge: 2019-02-17 | Disposition: A | Discharge status: Home / Self Care | Payer: 59 | Source: Ambulatory Visit | Attending: Internal Medicine | Admitting: Internal Medicine

## 2019-02-17 ENCOUNTER — Other Ambulatory Visit (INDEPENDENT_AMBULATORY_CARE_PROVIDER_SITE_OTHER): Payer: 59

## 2019-02-17 ENCOUNTER — Ambulatory Visit (INDEPENDENT_AMBULATORY_CARE_PROVIDER_SITE_OTHER): Payer: 59 | Admitting: Internal Medicine

## 2019-02-17 ENCOUNTER — Encounter: Payer: Self-pay | Admitting: Internal Medicine

## 2019-02-17 ENCOUNTER — Other Ambulatory Visit: Payer: Self-pay

## 2019-02-17 VITALS — BP 130/80 | HR 84 | Temp 98.0°F | Ht 66.0 in | Wt 148.0 lb

## 2019-02-17 DIAGNOSIS — M79641 Pain in right hand: Secondary | ICD-10-CM

## 2019-02-17 DIAGNOSIS — M2550 Pain in unspecified joint: Secondary | ICD-10-CM

## 2019-02-17 LAB — COMPREHENSIVE METABOLIC PANEL
ALT: 23 U/L (ref 0–35)
AST: 21 U/L (ref 0–37)
Albumin: 4.1 g/dL (ref 3.5–5.2)
Alkaline Phosphatase: 107 U/L (ref 39–117)
BUN: 14 mg/dL (ref 6–23)
CO2: 29 mEq/L (ref 19–32)
Calcium: 9.7 mg/dL (ref 8.4–10.5)
Chloride: 103 mEq/L (ref 96–112)
Creatinine, Ser: 0.8 mg/dL (ref 0.40–1.20)
GFR: 75.44 mL/min (ref >60.00)
Glucose, Bld: 93 mg/dL (ref 70–99)
Potassium: 3.8 mEq/L (ref 3.5–5.1)
Sodium: 140 mEq/L (ref 135–145)
Total Bilirubin: 0.7 mg/dL (ref 0.2–1.2)
Total Protein: 7.7 g/dL (ref 6.0–8.3)

## 2019-02-17 LAB — LIPID PANEL
Cholesterol: 228 mg/dL — ABNORMAL HIGH (ref 0–200)
HDL: 65.6 mg/dL (ref >39.00)
LDL Cholesterol: 154 mg/dL — ABNORMAL HIGH (ref 0–99)
NonHDL: 162.86
Total CHOL/HDL Ratio: 3
Triglycerides: 44 mg/dL (ref 0.0–149.0)
VLDL: 8.8 mg/dL (ref 0.0–40.0)

## 2019-02-17 LAB — CBC
HCT: 39.9 % (ref 36.0–46.0)
Hemoglobin: 13.3 g/dL (ref 12.0–15.0)
MCHC: 33.5 g/dL (ref 30.0–36.0)
MCV: 89.1 fl (ref 78.0–100.0)
Platelets: 251 10*3/uL (ref 150.0–400.0)
RBC: 4.48 Mil/uL (ref 3.87–5.11)
RDW: 12.4 % (ref 11.5–15.5)
WBC: 7.3 10*3/uL (ref 4.0–10.5)

## 2019-02-17 LAB — TSH: TSH: 2.9 u[IU]/mL (ref 0.35–4.50)

## 2019-02-17 LAB — VITAMIN B12: Vitamin B-12: 478 pg/mL (ref 211–911)

## 2019-02-17 LAB — VITAMIN D 25 HYDROXY (VIT D DEFICIENCY, FRACTURES): VITD: 34.59 ng/mL (ref 30.00–100.00)

## 2019-02-17 MED ORDER — ESTRADIOL 0.1 MG/GM VA CREA: TOPICAL_CREAM | VAGINAL | 12 refills | Status: DC

## 2019-02-17 NOTE — Assessment & Plan Note (Signed)
Checking for auto-immune arthritis given psoriasis and multiple joints hurting. Treat as indicated.

## 2019-02-17 NOTE — Patient Instructions (Addendum)
We will get the x-ray of the hand and the labs today.  We have sent in the estrogen cream to try instead once a day for 1 month then twice a week thereafter.

## 2019-02-17 NOTE — Progress Notes (Signed)
   Subjective:   Patient ID: Sheila Carter, female    DOB: 1965/11/01, 53 y.o.   MRN: [id number]  HPI The patient is a 53 YO female coming in for concerns about pain in the ribs. She has had this for about 3 months. Previously was told she had costochondritis for about 3 years but on the right side of ribs. This is on the left lower ribs. Hurts worse with bending or lifting or doing yardwork. Denies injury or overuse at onset. Denies waxing or waning. Has been taking tylenol and ibuprofen for it. Denies fevers or chills. Denies chest pains. Denies SOB. Denies pain radiating anywhere. About 4/10. Overall it is not improving.   Review of Systems  Constitutional: Negative.   HENT: Negative.   Eyes: Negative.   Respiratory: Negative for cough, chest tightness and shortness of breath.   Cardiovascular: Positive for chest pain. Negative for palpitations and leg swelling.  Gastrointestinal: Negative for abdominal distention, abdominal pain, constipation, diarrhea, nausea and vomiting.  Musculoskeletal: Positive for arthralgias and myalgias.  Skin: Negative.   Neurological: Negative.   Psychiatric/Behavioral: Negative.     Objective:  Physical Exam Constitutional:      Appearance: She is well-developed.  HENT:     Head: Normocephalic and atraumatic.  Neck:     Musculoskeletal: Normal range of motion.  Cardiovascular:     Rate and Rhythm: Normal rate and regular rhythm.  Pulmonary:     Effort: Pulmonary effort is normal. No respiratory distress.     Breath sounds: Normal breath sounds. No wheezing or rales.     Comments: Left flank around 6th rib to touch Chest:     Chest wall: Tenderness present.  Abdominal:     General: Bowel sounds are normal. There is no distension.     Palpations: Abdomen is soft.     Tenderness: There is no abdominal tenderness. There is no rebound.  Musculoskeletal:        General: Tenderness present.     Comments: Right hand with 2-4th fingers  tender, no swelling at the joints or redness  Skin:    General: Skin is warm and dry.  Neurological:     Mental Status: She is alert and oriented to person, place, and time.     Coordination: Coordination normal.     Vitals:   02/17/19 0800  BP: 130/80  Pulse: 84  Temp: 98 F (36.7 C)  TempSrc: Oral  SpO2: 99%  Weight: 148 lb (67.1 kg)  Height: 5\' 6"  (1.676 m)    This visit occurred during the SARS-CoV-2 public health emergency.  Safety protocols were in place, including screening questions prior to the visit, additional usage of staff PPE, and extensive cleaning of exam room while observing appropriate contact time as indicated for disinfecting solutions.   Assessment & Plan:

## 2019-02-17 NOTE — Assessment & Plan Note (Signed)
Checking x-ray for changes which may be associated with psoriatic arthritis.

## 2019-02-22 ENCOUNTER — Other Ambulatory Visit: Payer: 59

## 2019-02-22 DIAGNOSIS — M2550 Pain in unspecified joint: Secondary | ICD-10-CM

## 2019-02-24 LAB — ANA,IFA RA DIAG PNL W/RFLX TIT/PATN
Anti Nuclear Antibody (ANA): NEGATIVE
Cyclic Citrullin Peptide Ab: <16 UNITS
Rheumatoid fact SerPl-aCnc: <14 IU/mL (ref <14)

## 2019-05-20 ENCOUNTER — Ambulatory Visit (INDEPENDENT_AMBULATORY_CARE_PROVIDER_SITE_OTHER): Payer: 59 | Admitting: Gastroenterology

## 2019-05-20 ENCOUNTER — Encounter: Payer: Self-pay | Admitting: Gastroenterology

## 2019-05-20 VITALS — BP 126/70 | HR 64 | Temp 98.5°F | Ht 66.75 in | Wt 145.0 lb

## 2019-05-20 DIAGNOSIS — K219 Gastro-esophageal reflux disease without esophagitis: Secondary | ICD-10-CM | POA: Diagnosis not present

## 2019-05-20 DIAGNOSIS — K5900 Constipation, unspecified: Secondary | ICD-10-CM

## 2019-05-20 MED ORDER — FAMOTIDINE 20 MG PO TABS: ORAL_TABLET | ORAL | Status: DC

## 2019-05-20 MED ORDER — CITRUCEL PO POWD: 1.0000 | Freq: Every day | ORAL | Status: DC

## 2019-05-20 NOTE — Patient Instructions (Addendum)
If you are age 52 or older, your body mass index should be between 23-30. Your Body mass index is 22.88 kg/m. If this is out of the aforementioned range listed, please consider follow up with your Primary Care Provider.  If you are age 59 or younger, your body mass index should be between 19-25. Your Body mass index is 22.88 kg/m. If this is out of the aformentioned range listed, please consider follow up with your Primary Care Provider.   It has been recommended to you by your physician that you have a(n) endoscopy completed. Per your request, we did not schedule the procedure(s) today. Please contact our office at [phone] should you decide to have the procedure completed. You will be scheduled for a pre-visit and procedure at that time.   STOP: Dexilant  Please purchase the following medications over the counter and take as directed:  START: famotidine 20mg  take one tablet in the morning and one at bedtime.  Please start taking citrucel (orange flavored) powder fiber supplement.  This may cause some bloating at first but that usually goes away. Begin with a small spoonful and work your way up to a large, heaping spoonful daily over a week.  Thank you, Dr Ardis Hughs

## 2019-05-20 NOTE — Progress Notes (Signed)
Review of pertinent gastrointestinal problems: 1.  Routine risk for colon cancer.  Colonoscopy September 2020 found a single subcentimeter polyp which was not precancerous.  I recommended repeat colonoscopy at 10-year interval.  Also noted internal hemorrhoids at that time.   HPI: This is a very pleasant 54 year old woman who was referred to me by Hoyt Koch, *  to evaluate chronic GERD.    I last saw at the time of a screening colonoscopy about 7 or 8 months ago.  She had a single small polyp removed which was not precancerous.  Recommend repeat colonoscopy at 10-year interval.  Since then she has actually had constipation.  She has to really push and strain to move her bowels.  She tried probiotics but this did not help at all.  She has not seen any bleeding.  She was really here today however to discuss chronic chest pain and belching which she has had for many years.  She calls this her acid symptoms.  She has been intermittently on a variety of antiacid medicine such as omeprazole.  Omeprazole did not help at all.  Zantac twice daily did help but it was pulled from the market.  Most recently she has been taking Dexilant and it helped originally but it has since ceased to help lately.  She does not get pyrosis and she has no acid taste in her mouth.  She has no dysphagia.  She intentionally lost about 30 pounds over the course of 6 months which ended a year and a half ago.  She does not take NSAIDs.   Review of systems: Pertinent positive and negative review of systems were noted in the above HPI section. All other review negative.   Past Medical History:  Diagnosis Date  . Abnormal pap   . Anal fissure   . Anemia   . Blood transfusion without reported diagnosis   . Gallbladder disease 2010  . GERD (gastroesophageal reflux disease)   . Melanoma ([place]) 2002  . Thyroid nodule     Past Surgical History:  Procedure Laterality Date  . ADENOIDECTOMY  1980  . APPENDECTOMY   1984  . GALLBLADDER SURGERY  2010  . NOSE SURGERY  1992    Current Outpatient Medications  Medication Sig Dispense Refill  . betamethasone dipropionate 0.05 % lotion APP EXT TO THE SCALP BID PRN    . Dexlansoprazole (DEXILANT) 30 MG capsule Dexilant 30 mg capsule, delayed release    . estradiol (ESTRACE VAGINAL) 0.1 MG/GM vaginal cream Daily for 1 month, then twice a week ongoing. 42.5 g 12  . Estradiol (IMVEXXY MAINTENANCE PACK) 4 MCG INST Imvexxy Maintenance Pack 4 mcg vaginal insert  Insert 1 vaginal insert twice a week by vaginal route.    . nystatin-triamcinolone (MYCOLOG II) cream APPLY TO THE AFFECTED AREA TOPICALLY BID IN THE MORNING AND EVENING AS NEEDED FOR IRRITATION     No current facility-administered medications for this visit.    Allergies as of 05/20/2019 - Review Complete 05/20/2019  Allergen Reaction Noted  . Sulfonamide derivatives      Family History  Problem Relation Age of Onset  . Heart disease Maternal Grandmother   . Hypothyroidism Mother   . Diverticulosis Mother   . Hypertension Mother   . Gallbladder disease Sister        #1  . Nephrolithiasis Father   . Heart disease Father   . Hypertension Father   . Gallbladder disease Sister        #2  .  Diverticulosis Maternal Grandfather   . Hypertension Sister        x 2  . Colon cancer Neg Hx   . Colon polyps Neg Hx   . Esophageal cancer Neg Hx   . Rectal cancer Neg Hx   . Stomach cancer Neg Hx     Social History   Socioeconomic History  . Marital status: Divorced    Spouse name: Not on file  . Number of children: 2  . Years of education: Not on file  . Highest education level: Not on file  Occupational History  . 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 Smoking status: Never Smoker  . Smokeless tobacco: Never Used  Substance and Sexual Activity  . Alcohol use: Yes    Alcohol/week: 0.0 standard drinks    Comment: occ  . Drug use: No  . Sexual activity: Not on file  Other Topics Concern  . Not  on file  Social History Narrative  . Not on file   Social Determinants of Health   Financial Resource Strain:   . Difficulty of Paying Living Expenses: Not on file  Food Insecurity:   . Worried About Charity fundraiser in the Last Year: Not on file  . Ran Out of Food in the Last Year: Not on file  Transportation Needs:   . Lack of Transportation (Medical): Not on file  . Lack of Transportation (Non-Medical): Not on file  Physical Activity:   . Days of Exercise per Week: Not on file  . Minutes of Exercise per Session: Not on file  Stress:   . Feeling of Stress : Not on file  Social Connections:   . Frequency of Communication with Friends and Family: Not on file  . Frequency of Social Gatherings with Friends and Family: Not on file  . Attends Religious Services: Not on file  . Active Member of Clubs or Organizations: Not on file  . Attends Archivist Meetings: Not on file  . Marital Status: Not on file  Intimate Partner Violence:   . Fear of Current or Ex-Partner: Not on file  . Emotionally Abused: Not on file  . Physically Abused: Not on file  . Sexually Abused: Not on file     Physical Exam: BP 126/70   Pulse 64   Temp 98.5 F (36.9 C)   Ht 5' 6.75" (1.695 m)   Wt 145 lb (65.8 kg)   BMI 22.88 kg/m  Constitutional: generally well-appearing Psychiatric: alert and oriented x3 Eyes: extraocular movements intact Mouth: oral pharynx moist, no lesions Neck: supple no lymphadenopathy Cardiovascular: heart regular rate and rhythm Lungs: clear to auscultation bilaterally Abdomen: soft, nontender, nondistended, no obvious ascites, no peritoneal signs, normal bowel sounds Extremities: no lower extremity edema bilaterally Skin: no lesions on visible extremities   Assessment and plan: 54 y.o. female with chronic chest pains, belching, new constipation  First she has had constipation since a colonoscopy several months ago.  That is unusual but not unheard of.  I  recommended a trial of fiber supplements Citrucel to see if that will even out her bowels a bit.  Second she was really here today to discuss chronic possibly acid related chest pains and belching.  She has had most success with H2 blocker Zantac taken twice a day.  That was pulled from the market unfortunately.  I recommended she start taking famotidine 20 mg 1 pill twice daily to see if it will help as well as her Zantac  did.  I also recommend an upper endoscopy at her soonest convenience to check for chronic GERD damage, gastritis, peptic ulcer disease, H. pylori.  I see no reason for any further blood tests or imaging studies prior to then.  Please see the "Patient Instructions" section for addition details about the plan.   Owens Loffler, MD [facility] 05/20/2019, 10:54 AM  Cc: Hoyt Koch, *  Total time on date of encounter was45 minutes (this included time spent preparing to see the patient reviewing records; obtaining and/or reviewing separately obtained history; performing a medically appropriate exam and/or evaluation; counseling and educating the patient and family if present; ordering medications, tests or procedures if applicable; and documenting clinical information in the health record).

## 2019-05-21 ENCOUNTER — Telehealth: Payer: Self-pay | Admitting: Gastroenterology

## 2019-05-24 MED ORDER — FAMOTIDINE 20 MG PO TABS: ORAL_TABLET | ORAL | 11 refills | Status: DC

## 2019-05-24 NOTE — Telephone Encounter (Signed)
Sent script to pharmacy. Left message informing patient.

## 2019-06-03 ENCOUNTER — Telehealth: Payer: Self-pay | Admitting: Gastroenterology

## 2019-06-03 NOTE — Telephone Encounter (Signed)
Left message on machine to call back  

## 2019-06-03 NOTE — Telephone Encounter (Signed)
The pt called to schedule appt for EGD that Dr Ardis Hughs recommended.  Pt transferred to the [facility] to set up.

## 2019-09-10 ENCOUNTER — Telehealth: Payer: Self-pay | Admitting: Internal Medicine

## 2019-09-10 NOTE — Telephone Encounter (Signed)
New message:   Pt is calling and states she has name an appt with [facility] with Dr. Redmond Baseman. She states they are needing an referral from Dr. Sharlet Salina first. She states she has some kind of growth in her ear and some ear ringing. She also states her appt is on 09/16/19. Please advise.

## 2019-09-10 NOTE — Telephone Encounter (Signed)
I don't think we've seen her for this issue but would be happy to see her and do referral then

## 2019-09-13 ENCOUNTER — Telehealth: Payer: Self-pay | Admitting: Internal Medicine

## 2019-09-13 NOTE — Telephone Encounter (Signed)
New message:    LVM for pt to call office and schedule and appt.

## 2019-09-14 ENCOUNTER — Other Ambulatory Visit: Payer: Self-pay

## 2019-09-14 ENCOUNTER — Encounter: Payer: Self-pay | Admitting: Internal Medicine

## 2019-09-14 ENCOUNTER — Ambulatory Visit (INDEPENDENT_AMBULATORY_CARE_PROVIDER_SITE_OTHER): Payer: 59 | Admitting: Internal Medicine

## 2019-09-14 VITALS — BP 114/78 | HR 82 | Temp 98.4°F | Ht 66.75 in | Wt 141.0 lb

## 2019-09-14 DIAGNOSIS — H9319 Tinnitus, unspecified ear: Secondary | ICD-10-CM | POA: Insufficient documentation

## 2019-09-14 DIAGNOSIS — H9311 Tinnitus, right ear: Secondary | ICD-10-CM

## 2019-09-14 MED ORDER — NEOMYCIN-POLYMYXIN-HC 3.5-10000-1 OT SUSP: 3.0000 [drp] | Freq: Three times a day (TID) | OTIC | 0 refills | Status: DC

## 2019-09-14 NOTE — Patient Instructions (Signed)
We have sent in the ear drops to use 3 drops 3 times a day for 3 days.   

## 2019-09-14 NOTE — Progress Notes (Signed)
   Subjective:   Patient ID: Sheila Carter, female    DOB: 05-09-1965, 54 y.o.   MRN: [id number]  HPI The patient is a 54 YO female coming in for ringing in ears. Started a bit ago and she went and had ear wax removed. This did help for some time and then the sensation returned in the last week. Feels like some clicking and ringing in the right ear mostly. Denies fevers or chills or sinus problems. Denies any tooth problems. Having some concerns because when her ear was cleaned out there was some skin flakes and she is concerned about her ear canal. She has stopped using q-tips in the last month or so which she felt was causing the problem.   Review of Systems  Constitutional: Negative.   HENT: Positive for tinnitus.   Eyes: Negative.   Respiratory: Negative for cough, chest tightness and shortness of breath.   Cardiovascular: Negative for chest pain, palpitations and leg swelling.  Gastrointestinal: Negative for abdominal distention, abdominal pain, constipation, diarrhea, nausea and vomiting.  Musculoskeletal: Negative.   Skin: Negative.   Neurological: Negative.   Psychiatric/Behavioral: Negative.     Objective:  Physical Exam Constitutional:      Appearance: She is well-developed.  HENT:     Head: Normocephalic and atraumatic.     Comments: Right ear with bulging clear fluid TM, left ear canal and TM normal Cardiovascular:     Rate and Rhythm: Normal rate and regular rhythm.  Pulmonary:     Effort: Pulmonary effort is normal. No respiratory distress.     Breath sounds: Normal breath sounds. No wheezing or rales.  Abdominal:     General: Bowel sounds are normal. There is no distension.     Palpations: Abdomen is soft.     Tenderness: There is no abdominal tenderness. There is no rebound.  Musculoskeletal:     Cervical back: Normal range of motion.  Skin:    General: Skin is warm and dry.  Neurological:     Mental Status: She is alert and oriented to person,  place, and time.     Coordination: Coordination normal.     Vitals:   09/14/19 0806  BP: 114/78  Pulse: 82  Temp: 98.4 F (36.9 C)  TempSrc: Oral  SpO2: 98%  Weight: 141 lb (64 kg)  Height: 5' 6.75" (1.695 m)    This visit occurred during the SARS-CoV-2 public health emergency.  Safety protocols were in place, including screening questions prior to the visit, additional usage of staff PPE, and extensive cleaning of exam room while observing appropriate contact time as indicated for disinfecting solutions.   Assessment & Plan:

## 2019-09-14 NOTE — Assessment & Plan Note (Signed)
Rx cortisporin ear drops for fluid in ear. If no resolution let us know and we will refer to ENT.

## 2019-10-22 ENCOUNTER — Other Ambulatory Visit: Payer: Self-pay

## 2019-10-22 ENCOUNTER — Encounter: Payer: Self-pay | Admitting: Internal Medicine

## 2019-10-22 ENCOUNTER — Ambulatory Visit (INDEPENDENT_AMBULATORY_CARE_PROVIDER_SITE_OTHER): Payer: 59 | Admitting: Internal Medicine

## 2019-10-22 VITALS — BP 126/84 | HR 80 | Temp 98.1°F | Ht 66.75 in | Wt 144.0 lb

## 2019-10-22 DIAGNOSIS — R202 Paresthesia of skin: Secondary | ICD-10-CM | POA: Diagnosis not present

## 2019-10-22 DIAGNOSIS — H9311 Tinnitus, right ear: Secondary | ICD-10-CM | POA: Diagnosis not present

## 2019-10-22 DIAGNOSIS — R200 Anesthesia of skin: Secondary | ICD-10-CM | POA: Diagnosis not present

## 2019-10-22 NOTE — Assessment & Plan Note (Signed)
Just tingling in left arm and suspect from moving boxes recently. Labs checked Nov 2020 and if no resolution in 1-2 weeks can recheck.

## 2019-10-22 NOTE — Patient Instructions (Signed)
You can use the cream on the q tip daily for 1 week then 2-3 times a week for 2-3 weeks.

## 2019-10-22 NOTE — Progress Notes (Signed)
   Subjective:   Patient ID: Sheila Carter, female    DOB: 1965-09-10, 54 y.o.   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   Review of Systems  Constitutional: Negative.   HENT: Positive for hearing loss.        Ear blockage  Eyes: Negative.   Respiratory: Negative for cough, chest tightness and shortness of breath.   Cardiovascular: Negative for chest pain, palpitations and leg swelling.  Gastrointestinal: Negative for abdominal distention, abdominal pain, constipation, diarrhea, nausea and vomiting.  Musculoskeletal: Negative.   Skin: Negative.   Neurological: Negative.        Tingling left arm  Psychiatric/Behavioral: Negative.     Objective:  Physical Exam Constitutional:      Appearance: She is well-developed.  HENT:     Head: Normocephalic and atraumatic.     Right Ear: Tympanic membrane normal.     Left Ear: Tympanic membrane normal.     Ears:     Comments:  right ear canal impacted with copious hard wax, examination post ear lavage canal is clear and no bleeding or complications noted. Cardiovascular:     Rate and Rhythm: Normal rate and regular rhythm.  Pulmonary:     Effort: Pulmonary effort is normal. No respiratory distress.     Breath sounds: Normal breath sounds. No wheezing or rales.  Abdominal:     General: Bowel sounds are normal. There is no distension.     Palpations: Abdomen is soft.     Tenderness: There is no abdominal tenderness. There is no rebound.  Musculoskeletal:     Cervical back: Normal range of motion.  Skin:    General:  Skin is warm and dry.  Neurological:     Mental Status: She is alert and oriented to person, place, and time.     Coordination: Coordination normal.     Vitals:   10/22/19 1324  BP: 126/84  Pulse: 80  Temp: 98.1 F (36.7 C)  TempSrc: Oral  SpO2: 99%  Weight: 144 lb (65.3 kg)  Height: 5' 6.75" (1.695 m)    This visit occurred during the SARS-CoV-2 public health emergency.  Safety protocols were in place, including screening questions prior to the visit, additional usage of staff PPE, and extensive cleaning of exam room while observing appropriate contact time as indicated for disinfecting solutions.   Assessment & Plan:

## 2019-10-22 NOTE — Assessment & Plan Note (Signed)
Overall improved from prior, ear wax removal done during office visit and tolerated well with hearing restored. We discussed that she can use cream and interval for the changes in ear canal.

## 2020-04-26 ENCOUNTER — Ambulatory Visit (INDEPENDENT_AMBULATORY_CARE_PROVIDER_SITE_OTHER): Payer: 59 | Admitting: Internal Medicine

## 2020-04-26 ENCOUNTER — Telehealth: Payer: Self-pay | Admitting: Internal Medicine

## 2020-04-26 ENCOUNTER — Encounter: Payer: Self-pay | Admitting: Internal Medicine

## 2020-04-26 ENCOUNTER — Other Ambulatory Visit: Payer: Self-pay

## 2020-04-26 VITALS — BP 144/80 | HR 95 | Temp 98.0°F | Resp 18 | Ht 66.75 in | Wt 147.8 lb

## 2020-04-26 DIAGNOSIS — R0789 Other chest pain: Secondary | ICD-10-CM

## 2020-04-26 DIAGNOSIS — K219 Gastro-esophageal reflux disease without esophagitis: Secondary | ICD-10-CM | POA: Diagnosis not present

## 2020-04-26 MED ORDER — DEXLANSOPRAZOLE 30 MG PO CPDR: 30.0000 mg | DELAYED_RELEASE_CAPSULE | Freq: Every day | ORAL | 5 refills | Status: DC

## 2020-04-26 MED ORDER — DEXLANSOPRAZOLE 30 MG PO CPDR: 30.0000 mg | DELAYED_RELEASE_CAPSULE | Freq: Two times a day (BID) | ORAL | 5 refills | Status: DC

## 2020-04-26 NOTE — Patient Instructions (Signed)
The EKG looks normal with no signs of heart problems.  Start taking the dexilant again which we have sent in for you.

## 2020-04-26 NOTE — Telephone Encounter (Signed)
Patient is calling again in regards to a PA. Please advise.

## 2020-04-26 NOTE — Progress Notes (Unsigned)
   Subjective:   Patient ID: Sheila Carter, female    DOB: [birth year], 55 y.o.   MRN: [id number]  HPI The patient is a 55 YO female coming in for concerns about GERD. She was taking pepcid and tried to stop taking this. She is having pressure in the chest which goes into the left shoulder. She denies sour taste in mouth. No coughing. Some burping more than usual which helps temporarily. Taking tums which helps some also. Denies fevers or chills. Denies pain with exertion. Lasting most of the day and worse at night for the last 1-2 days. Did take pepcid last night and this has helped some but still having symptoms. Denies dietary changes.  Review of Systems  Constitutional: Negative.   HENT: Negative.   Eyes: Negative.   Respiratory: Positive for chest tightness. Negative for cough and shortness of breath.   Cardiovascular: Negative for chest pain, palpitations and leg swelling.  Gastrointestinal: Negative for abdominal distention, abdominal pain, constipation, diarrhea, nausea and vomiting.       GERD  Musculoskeletal: Negative.   Skin: Negative.   Neurological: Negative.   Psychiatric/Behavioral: Negative.     Objective:  Physical Exam Constitutional:      Appearance: She is well-developed and well-nourished.  HENT:     Head: Normocephalic and atraumatic.  Eyes:     Extraocular Movements: EOM normal.  Cardiovascular:     Rate and Rhythm: Normal rate and regular rhythm.  Pulmonary:     Effort: Pulmonary effort is normal. No respiratory distress.     Breath sounds: Normal breath sounds. No wheezing or rales.  Abdominal:     General: Bowel sounds are normal. There is no distension.     Palpations: Abdomen is soft.     Tenderness: There is no abdominal tenderness. There is no rebound.  Musculoskeletal:        General: No edema.     Cervical back: Normal range of motion.  Skin:    General: Skin is warm and dry.  Neurological:     Mental Status: She is alert and  oriented to person, place, and time.     Coordination: Coordination normal.  Psychiatric:        Mood and Affect: Mood and affect normal.     Vitals:   04/26/20 0947  BP: (!) 144/80  Pulse: 95  Resp: 18  Temp: 98 F (36.7 C)  TempSrc: Oral  SpO2: 99%  Weight: 147 lb 12.8 oz (67 kg)  Height: 5' 6.75" (1.695 m)   EKG: Rate 76, axis normal, interval normal, sinus, no st or t wave changes, no significant change compared to 2019  This visit occurred during the SARS-CoV-2 public health emergency.  Safety protocols were in place, including screening questions prior to the visit, additional usage of staff PPE, and extensive cleaning of exam room while observing appropriate contact time as indicated for disinfecting solutions.   Assessment & Plan:

## 2020-04-26 NOTE — Telephone Encounter (Signed)
Patient called and said that the pharmacy is needing a PA for Dexlansoprazole (DEXILANT) 30 MG capsule. Please advise. Please call the patient back.

## 2020-04-28 DIAGNOSIS — R0789 Other chest pain: Secondary | ICD-10-CM | POA: Insufficient documentation

## 2020-04-28 MED ORDER — DEXLANSOPRAZOLE 60 MG PO CPDR: 60.0000 mg | DELAYED_RELEASE_CAPSULE | Freq: Every day | ORAL | 3 refills | Status: DC

## 2020-04-28 NOTE — Assessment & Plan Note (Signed)
Suspected from GERD. EKG done without changes to rule out cardiac etiology. Rx dexilant to see if this helps as she has taken this with success in the past.

## 2020-04-28 NOTE — Telephone Encounter (Signed)
60 mg sent in

## 2020-04-28 NOTE — Telephone Encounter (Signed)
Attempting to initiate a PA on covermymeds and its prompting that Dexilant 60 mg doesn't require a PA. Would you like me to proceed with the PA or would you like to patient to take 60 mg. Please advise

## 2020-04-28 NOTE — Addendum Note (Signed)
Addended by: Hoyt Koch on: 04/28/2020 09:43 AM   Modules accepted: Orders

## 2020-04-28 NOTE — Assessment & Plan Note (Signed)
Restart dexilant for several weeks and then can resume pepcid if it was previously effective.

## 2020-05-09 ENCOUNTER — Telehealth: Payer: Self-pay | Admitting: Gastroenterology

## 2020-05-09 NOTE — Telephone Encounter (Signed)
Left message on machine to call back  

## 2020-05-09 NOTE — Telephone Encounter (Signed)
Patient is seeking advise for Sheila Carter issues please advise.

## 2020-05-09 NOTE — Telephone Encounter (Signed)
The pt has a return of GERD and is taking Dexilant and pepcid.  Although, she is not taking it as prescribed.  I advised her to take her Dexilant 20-30 min before meal and pepcid at bedtime.  She also has a follow up in March with Anderson Malta.  She was also advised of the reflux precautions.  She will keep appt as planned

## 2020-05-24 ENCOUNTER — Ambulatory Visit (INDEPENDENT_AMBULATORY_CARE_PROVIDER_SITE_OTHER): Payer: 59 | Admitting: Physician Assistant

## 2020-05-24 ENCOUNTER — Other Ambulatory Visit: Payer: Self-pay

## 2020-05-24 ENCOUNTER — Encounter: Payer: Self-pay | Admitting: Physician Assistant

## 2020-05-24 VITALS — BP 102/74 | HR 95 | Ht 66.75 in | Wt 145.8 lb

## 2020-05-24 DIAGNOSIS — R0789 Other chest pain: Secondary | ICD-10-CM

## 2020-05-24 DIAGNOSIS — K219 Gastro-esophageal reflux disease without esophagitis: Secondary | ICD-10-CM

## 2020-05-24 MED ORDER — OMEPRAZOLE 20 MG PO CPDR: 20.0000 mg | DELAYED_RELEASE_CAPSULE | Freq: Two times a day (BID) | ORAL | 11 refills | Status: DC

## 2020-05-24 MED ORDER — FAMOTIDINE 20 MG PO TABS: ORAL_TABLET | ORAL | 11 refills | Status: DC

## 2020-05-24 NOTE — Patient Instructions (Signed)
If you are age 53 or older, your body mass index should be between 23-30. Your Body mass index is 23.01 kg/m. If this is out of the aforementioned range listed, please consider follow up with your Primary Care Provider.  If you are age 23 or younger, your body mass index should be between 19-25. Your Body mass index is 23.01 kg/m. If this is out of the aformentioned range listed, please consider follow up with your Primary Care Provider.   You have been scheduled for an endoscopy. Please follow written instructions given to you at your visit today. If you use inhalers (even only as needed), please bring them with you on the day of your procedure.  We have sent the following medications to your pharmacy for you to pick up at your convenience: Famotidine and Omeprazole.  Thank you for choosing me and [facility].  Ellouise Newer, PA-C

## 2020-05-24 NOTE — Progress Notes (Signed)
I agree with the above note, plan 

## 2020-05-24 NOTE — Progress Notes (Signed)
Chief Complaint: GERD  Review of pertinent gastrointestinal problems: 1.  Routine risk for colon cancer.  Colonoscopy September 2020 found a single subcentimeter polyp which was not precancerous.  I recommended repeat colonoscopy at 10-year interval.  Also noted internal hemorrhoids at that time.  HPI:    [name] is a 55 year old female with a past medical history as listed below, known to Dr. Ardis Hughs, who was referred to me by Hoyt Koch, * for a complaint of GERD.      05/20/2019 patient followed up in clinic with Dr. Ardis Hughs for chronic reflux.  At that time discussed chest pain and belching for years.  At that time was recommended she use Pepcid 20 mg twice daily.  Is also recommended she have an EGD to check for chronic GERD damage.    Today, the patient presents to clinic and tells me that she takes her acid reflux medicines "off-and-on", she had stopped these back in August 2021 because she was doing well and did okay until December when she started to feel little acid reflux but "nothing I had to take medicine for", then in January it got worse and she started her Pepcid 20 mg every day, then in February on the fourth she had severe episode of chest pain and tingling down her arms as well as burping and pressure which was worse about an hour or 2 after eating and worse at night.  She was started on Dexilant 30 mg once daily for 2 weeks by her PCP but could not afford the full prescription of this medicine as it was not covered by her insurance.  She called our office and was started on Omeprazole 20 mg twice a day, it got a lot better but was still not gone and she called back and was started on Pepcid 20 mg at night.  Patient tells me that currently she is about 70% better.  She still has some symptoms but it is "nothing like it was".  She is still on a very bland diet.    Does tell me she was recently diagnosed with osteoporosis/osteopenia and is worried that this may have been a  side effect from PPI usage.    Denies fever, chills, weight loss, change in bowel habits or blood in her stool.     Past Medical History:  Diagnosis Date  . Abnormal pap   . Anal fissure   . Anemia   . Blood transfusion without reported diagnosis   . Gallbladder disease 2010  . GERD (gastroesophageal reflux disease)   . Melanoma ([place]) 2002  . Thyroid nodule     Past Surgical History:  Procedure Laterality Date  . ADENOIDECTOMY  1980  . APPENDECTOMY  1984  . GALLBLADDER SURGERY  2010  . NOSE SURGERY  1992    Current Outpatient Medications  Medication Sig Dispense Refill  . clobetasol cream (TEMOVATE) 6.19 % Apply 1 application topically as needed.    Marland Kitchen estradiol (ESTRACE VAGINAL) 0.1 MG/GM vaginal cream Daily for 1 month, then twice a week ongoing. 42.5 g 12  . famotidine (PEPCID) 20 MG tablet Take 1 tablet in the morning and one tablet at bedtime 30 tablet 11  . ibandronate (BONIVA) 150 MG tablet Take 150 mg by mouth every 30 (thirty) days. (Patient not taking: Reported on 05/24/2020)     No current facility-administered medications for this visit.    Allergies as of 05/24/2020 - Review Complete 05/24/2020  Allergen Reaction Noted  . Sulfonamide  derivatives      Family History  Problem Relation Age of Onset  . Heart disease Maternal Grandmother   . Hypothyroidism Mother   . Diverticulosis Mother   . Hypertension Mother   . Gallbladder disease Sister        #1  . Nephrolithiasis Father   . Heart disease Father   . Hypertension Father   . Gallbladder disease Sister        #2  . Diverticulosis Maternal Grandfather   . Hypertension Sister        x 2  . Colon cancer Neg Hx   . Colon polyps Neg Hx   . Esophageal cancer Neg Hx   . Rectal cancer Neg Hx   . Stomach cancer Neg Hx     Social History   Socioeconomic History  . Marital status: Divorced    Spouse name: Not on file  . Number of children: 2  . Years of education: Not on file  . Highest education  level: Not on file  Occupational History  . 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 Smoking status: Never Smoker  . Smokeless tobacco: Never Used  Vaping Use  . Vaping Use: Never used  Substance and Sexual Activity  . Alcohol use: Yes    Alcohol/week: 0.0 standard drinks    Comment: occ  . Drug use: No  . Sexual activity: Not on file  Other Topics Concern  . Not on file  Social History Narrative  . Not on file   Social Determinants of Health   Financial Resource Strain: Not on file  Food Insecurity: Not on file  Transportation Needs: Not on file  Physical Activity: Not on file  Stress: Not on file  Social Connections: Not on file  Intimate Partner Violence: Not on file    Review of Systems:    Constitutional: No weight loss, fever or chills Cardiovascular: No chest pain Respiratory: No SOB  Gastrointestinal: See HPI and otherwise negative   Physical Exam:  Vital signs: BP 102/74   Pulse 95   Ht 5' 6.75" (1.695 m)   Wt 145 lb 12.8 oz (66.1 kg)   SpO2 99%   BMI 23.01 kg/m   Constitutional:   Pleasant Caucasian female appears to be in NAD, Well developed, Well nourished, alert and cooperative Respiratory: Respirations even and unlabored. Lungs clear to auscultation bilaterally.   No wheezes, crackles, or rhonchi.  Cardiovascular: Normal S1, S2. No MRG. Regular rate and rhythm. No peripheral edema, cyanosis or pallor.  Gastrointestinal:  Soft, nondistended,mild epigastric ttp, No rebound or guarding. Normal bowel sounds. No appreciable masses or hepatomegaly. Psychiatric: Demonstrates good judgement and reason without abnormal affect or behaviors.  No recent labs.  Assessment: 1.  GERD: Chronic and intermittent for the patient, worsening symptoms since January, better now with a twice daily PPI and Pepcid 2.  Atypical chest pain: With above  Plan: 1.  Scheduled patient for diagnostic EGD due to chronic reflux symptoms and epigastric pain with Dr. Ardis Hughs.  Patient was  provided with a detailed list of risks for the procedure and she agrees to proceed. 2.  For now would go ahead and increase Pepcid to 20 mg twice daily, every morning and nightly, prescribed #60 with 11 refills. 3.  Also continue Omeprazole 20 mg twice daily, 30-60 minutes before eating breakfast and dinner #60 with 11 refills. 4.  Discussed with patient that pending EGD, we will try to limit her use of PPIs due to new  diagnosis of osteoporosis.  If she can be maintained on Pepcid alone then this would be most beneficial for her.  Even if it is 40 mg twice a day. 5.  Patient to follow in clinic per recommendations from Dr. Ardis Hughs after time of procedure.  Ellouise Newer, PA-C [facility] 05/24/2020, 9:32 AM  Cc: Hoyt Koch, *

## 2020-05-26 ENCOUNTER — Encounter: Payer: Self-pay | Admitting: Gastroenterology

## 2020-05-30 ENCOUNTER — Other Ambulatory Visit: Payer: Self-pay

## 2020-05-30 ENCOUNTER — Ambulatory Visit (AMBULATORY_SURGERY_CENTER): Payer: 59 | Admitting: Gastroenterology

## 2020-05-30 ENCOUNTER — Encounter: Payer: Self-pay | Admitting: Gastroenterology

## 2020-05-30 VITALS — BP 136/85 | HR 72 | Temp 98.3°F | Resp 13 | Ht 66.75 in | Wt 145.0 lb

## 2020-05-30 DIAGNOSIS — K219 Gastro-esophageal reflux disease without esophagitis: Secondary | ICD-10-CM

## 2020-05-30 DIAGNOSIS — K2970 Gastritis, unspecified, without bleeding: Secondary | ICD-10-CM

## 2020-05-30 HISTORY — PX: UPPER GASTROINTESTINAL ENDOSCOPY: SHX188

## 2020-05-30 MED ORDER — SODIUM CHLORIDE 0.9 % IV SOLN: 500.0000 mL | Freq: Once | INTRAVENOUS | Status: DC

## 2020-05-30 NOTE — Op Note (Signed)
[place] Patient Name: Sheila Carter Procedure Date: 05/30/2020 10:00 AM MRN: [id number] Endoscopist: Milus Banister , MD Age: 55 Referring MD:  Date of Birth: [birth year] Gender: Female Account #: [id number] Procedure:                Upper GI endoscopy Indications:              GERD, atypical chest pains Medicines:                Monitored Anesthesia Care Procedure:                Pre-Anesthesia Assessment:                           - Prior to the procedure, a History and Physical                            was performed, and patient medications and                            allergies were reviewed. The patient's tolerance of                            previous anesthesia was also reviewed. The risks                            and benefits of the procedure and the sedation                            options and risks were discussed with the patient.                            All questions were answered, and informed consent                            was obtained. Prior Anticoagulants: The patient has                            taken no previous anticoagulant or antiplatelet                            agents. ASA Grade Assessment: II - A patient with                            mild systemic disease. After reviewing the risks                            and benefits, the patient was deemed in                            satisfactory condition to undergo the procedure.                           After obtaining informed consent, the endoscope was  passed under direct vision. Throughout the                            procedure, the patient's blood pressure, pulse, and                            oxygen saturations were monitored continuously. The                            Endoscope was introduced through the mouth, and                            advanced to the second part of duodenum. The upper                            GI endoscopy was  accomplished without difficulty.                            The patient tolerated the procedure well. Scope In: Scope Out: Findings:                 Minimal inflammation characterized by erythema and                            friability was found in the entire examined                            stomach. Biopsies were taken with a cold forceps                            for histology.                           The exam was otherwise without abnormality. Complications:            No immediate complications. Estimated blood loss:                            None. Estimated Blood Loss:     Estimated blood loss: none. Impression:               - Mild, non-specific gastritis.                           - The examination was otherwise normal. Recommendation:           - Patient has a contact number available for                            emergencies. The signs and symptoms of potential                            delayed complications were discussed with the                            patient. Return to normal activities tomorrow.  Written discharge instructions were provided to the                            patient.                           - Resume previous diet.                           - Continue present medications.                           - Await pathology results. If negative for H.                            pylori, will likely increase BID PPI to omeprazole                            40mg  pills (from 2) and continue pepcid at bedtime                            only. Milus Banister, MD 05/30/2020 10:13:17 AM This report has been signed electronically.

## 2020-05-30 NOTE — Patient Instructions (Signed)
YOU HAD AN ENDOSCOPIC PROCEDURE TODAY AT [facility]:   Refer to the procedure report that was given to you for any specific questions about what was found during the examination.  If the procedure report does not answer your questions, please call your gastroenterologist to clarify.  If you requested that your care partner not be given the details of your procedure findings, then the procedure report has been included in a sealed envelope for you to review at your convenience later.  YOU SHOULD EXPECT: Some feelings of bloating in the abdomen. Passage of more gas than usual.  Walking can help get rid of the air that was put into your GI tract during the procedure and reduce the bloating. If you had a lower endoscopy (such as a colonoscopy or flexible sigmoidoscopy) you may notice spotting of blood in your stool or on the toilet paper. If you underwent a bowel prep for your procedure, you may not have a normal bowel movement for a few days.  Please Note:  You might notice some irritation and congestion in your nose or some drainage.  This is from the oxygen used during your procedure.  There is no need for concern and it should clear up in a day or so.  SYMPTOMS TO REPORT IMMEDIATELY:    Following upper endoscopy (EGD)  Vomiting of blood or coffee ground material  New chest pain or pain under the shoulder blades  Painful or persistently difficult swallowing  New shortness of breath  Fever of 100F or higher  Black, tarry-looking stools  For urgent or emergent issues, a gastroenterologist can be reached at any hour by calling [phone]. Do not use MyChart messaging for urgent concerns.    DIET:  We do recommend a small meal at first, but then you may proceed to your regular diet.  Drink plenty of fluids but you should avoid alcoholic beverages for 24 hours.  MEDICATIONS: Continue present medications.  Please see handouts given to you by your recovery nurse.  FOLLOW  UP: Await pathology results. If negative for H. Pylori, will likely increase your twice daily PPI to Omeprazole 40 mg pills (from 2) and continue pepcid at bedtime only.  Thank  You for allowing [place] to provide for your healthcare needs today.  ACTIVITY:  You should plan to take it easy for the rest of today and you should NOT DRIVE or use heavy machinery until tomorrow (because of the sedation medicines used during the test).    FOLLOW UP: Our staff will call the number listed on your records 48-72 hours following your procedure to check on you and address any questions or concerns that you may have regarding the information given to you following your procedure. If we do not reach you, we will leave a message.  We will attempt to reach you two times.  During this call, we will ask if you have developed any symptoms of COVID 19. If you develop any symptoms (ie: fever, flu-like symptoms, shortness of breath, cough etc.) before then, please call [phone].  If you test positive for Covid 19 in the 2 weeks post procedure, please call and report this information to [place].    If any biopsies were taken you will be contacted by phone or by letter within the next 1-3 weeks.  Please call us at [phone] if you have not heard about the biopsies in 3 weeks.    SIGNATURES/CONFIDENTIALITY: You and/or your care partner have signed paperwork  which will be entered into your electronic medical record.  These signatures attest to the fact that that the information above on your After Visit Summary has been reviewed and is understood.  Full responsibility of the confidentiality of this discharge information lies with you and/or your care-partner.

## 2020-05-30 NOTE — Progress Notes (Signed)
Pt's states no medical or surgical changes since previsit or office visit.  ° °Vitals CW °

## 2020-05-30 NOTE — Progress Notes (Signed)
Called to room to assist during endoscopic procedure.  Patient ID and intended procedure confirmed with present staff. Received instructions for my participation in the procedure from the performing physician.  

## 2020-05-30 NOTE — Progress Notes (Signed)
pt tolerated well. VSS. awake and to recovery. Report given to RN. Bite block placed while awake by Tech. Pt without c/o. To recovery with bite block insitu. No signs of trauma.

## 2020-06-01 ENCOUNTER — Telehealth: Payer: Self-pay

## 2020-06-01 NOTE — Telephone Encounter (Signed)
  Follow up Call-  Call back number 05/30/2020 12/23/2018  Post procedure Call Back phone  # [phone]  Permission to leave phone message Yes Yes  Some recent data might be hidden     Patient questions:  Do you have a fever, pain , or abdominal swelling? No. Pain Score  0 *  Have you tolerated food without any problems? Yes.    Have you been able to return to your normal activities? Yes.    Do you have any questions about your discharge instructions: Diet   No. Medications  No. Follow up visit  No.  Do you have questions or concerns about your Care? No.  Actions: * If pain score is 4 or above: No action needed, pain <4.  1. Have you developed a fever since your procedure? no  2.   Have you had an respiratory symptoms (SOB or cough) since your procedure? no  3.   Have you tested positive for COVID 19 since your procedure no  4.   Have you had any family members/close contacts diagnosed with the COVID 19 since your procedure?  no   If yes to any of these questions please route to Joylene John, RN and Joella Prince, RN

## 2020-06-12 ENCOUNTER — Other Ambulatory Visit: Payer: Self-pay

## 2020-06-12 MED ORDER — OMEPRAZOLE 40 MG PO CPDR: 40.0000 mg | DELAYED_RELEASE_CAPSULE | Freq: Two times a day (BID) | ORAL | 6 refills | Status: DC

## 2020-07-10 ENCOUNTER — Encounter: Payer: 59 | Admitting: Gastroenterology

## 2020-08-11 ENCOUNTER — Encounter: Payer: Self-pay | Admitting: Gastroenterology

## 2020-08-11 ENCOUNTER — Ambulatory Visit (INDEPENDENT_AMBULATORY_CARE_PROVIDER_SITE_OTHER): Payer: 59 | Admitting: Gastroenterology

## 2020-08-11 VITALS — BP 102/70 | HR 87 | Ht 66.75 in | Wt 143.0 lb

## 2020-08-11 DIAGNOSIS — R1013 Epigastric pain: Secondary | ICD-10-CM | POA: Diagnosis not present

## 2020-08-11 DIAGNOSIS — R0789 Other chest pain: Secondary | ICD-10-CM

## 2020-08-11 MED ORDER — FAMOTIDINE 20 MG PO TABS: 20.0000 mg | ORAL_TABLET | Freq: Three times a day (TID) | ORAL | 3 refills | Status: DC

## 2020-08-11 NOTE — Progress Notes (Signed)
Review of pertinent gastrointestinal problems: 1.  Routine risk for colon cancer.  Colonoscopy September 2020 found a single subcentimeter polyp which was not precancerous.  I recommended repeat colonoscopy at 10-year interval.  Also noted internal hemorrhoids at that time. 2.  Atypical chest pains, chronic GERD symptoms led to EGD March 2022.  Mild gastritis was noted, biopsies showed no sign of H. pylori.  She was recommended to take proton pump inhibitor twice daily shortly for meals and continue Pepcid at bedtime only.   HPI: This is a very pleasant 55 year old woman whom I last saw 2 months ago at the time of an upper endoscopy.  See those results summarized above  Her weight is down 2 pounds since her last office visit here 2 months ago.  She has weaned herself down to omeprazole 1 pill shortly before breakfast every morning.  She is still intermittently bothered by postprandial left chest mild discomforts.  Not associate with shortness of breath, this is never occurring with exercise.  She is also very bothered by postprandial belching, frequently, often.  She does not like the idea of being on proton pump inhibitors because of potential relation to osteoporosis which she has already been diagnosed with.   ROS: complete GI ROS as described in HPI, all other review negative.  Constitutional:  No unintentional weight loss   Past Medical History:  Diagnosis Date  . Abnormal pap   . Anal fissure   . Anemia   . Blood transfusion without reported diagnosis   . Gallbladder disease 2010  . GERD (gastroesophageal reflux disease)   . Melanoma ([place]) 2002  . Thyroid nodule     Past Surgical History:  Procedure Laterality Date  . ADENOIDECTOMY  1980  . APPENDECTOMY  1984  . GALLBLADDER SURGERY  2010  . NOSE SURGERY  1992  . UPPER GASTROINTESTINAL ENDOSCOPY  05/30/2020   Owens Loffler    Current Outpatient Medications  Medication Sig Dispense Refill  . clobetasol cream  (TEMOVATE) 1.61 % Apply 1 application topically as needed.    Marland Kitchen estradiol (ESTRACE VAGINAL) 0.1 MG/GM vaginal cream Daily for 1 month, then twice a week ongoing. 42.5 g 12  . ibandronate (BONIVA) 150 MG tablet Take 150 mg by mouth every 30 (thirty) days.    . metroNIDAZOLE (METROGEL) 1 % gel Apply 1 application topically daily.    Marland Kitchen omeprazole (PRILOSEC) 40 MG capsule Take 40 mg by mouth every morning.     Current Facility-Administered Medications  Medication Dose Route Frequency Provider Last Rate Last Admin  . 0.9 %  sodium chloride infusion  500 mL Intravenous Once Milus Banister, MD        Allergies as of 08/11/2020 - Review Complete 08/11/2020  Allergen Reaction Noted  . Sulfonamide derivatives    . Lidocaine-epinephrine Palpitations 05/30/2020    Family History  Problem Relation Age of Onset  . Heart disease Maternal Grandmother   . Hypothyroidism Mother   . Diverticulosis Mother   . Hypertension Mother   . Gallbladder disease Sister        #1  . Nephrolithiasis Father   . Heart disease Father   . Hypertension Father   . Gallbladder disease Sister        #2  . Diverticulosis Maternal Grandfather   . Hypertension Sister        x 2  . Colon cancer Neg Hx   . Colon polyps Neg Hx   . Esophageal cancer Neg Hx   .  Rectal cancer Neg Hx   . Stomach cancer Neg Hx     Social History   Socioeconomic History  . Marital status: Divorced    Spouse name: Not on file  . Number of children: 2  . Years of education: Not on file  . Highest education level: Not on file  Occupational History  . 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 Smoking status: Never Smoker  . Smokeless tobacco: Never Used  Vaping Use  . Vaping Use: Never used  Substance and Sexual Activity  . Alcohol use: Yes    Alcohol/week: 0.0 standard drinks    Comment: occ  . Drug use: No  . Sexual activity: Not Currently  Other Topics Concern  . Not on file  Social History Narrative  . Not on file   Social  Determinants of Health   Financial Resource Strain: Not on file  Food Insecurity: Not on file  Transportation Needs: Not on file  Physical Activity: Not on file  Stress: Not on file  Social Connections: Not on file  Intimate Partner Violence: Not on file     Physical Exam: BP 102/70   Pulse 87   Ht 5' 6.75" (1.695 m)   Wt 143 lb (64.9 kg)   SpO2 99%   BMI 22.57 kg/m  Constitutional: generally well-appearing Psychiatric: alert and oriented x3 Abdomen: soft, nontender, nondistended, no obvious ascites, no peritoneal signs, normal bowel sounds No peripheral edema noted in lower extremities  Assessment and plan: 55 y.o. female with atypical chest pains, GERD  We had a very nice discussion about how it can be difficult to interpret exactly where the problem is located based on location of symptoms.  I do think she has GI related left chest pains.  She is admittedly hypochondriac I think there would be value in her hearing this from a cardiologist and so we are going to arrange for cardiology referral.  Her biggest complaint besides the intermittent left chest discomfort is belching.  I do have some concern that maybe she is having side effect of dyspepsia from proton pump inhibitor.  She is also concerned about proton pump inhibitors possibly causing osteoporosis.  I recommended we take a change in tach here.  She will stop taking her proton pump inhibitor and instead she is going to start taking Pepcid 20 mg pills 1 pill after waking, 1 pill at bedtime and 1 pill somewhere in the middle.  She will return to see me in 6 to 8 weeks and sooner if needed.  If she notices no difference and no improvement after a month or 2 she might resume her omeprazole and start taking a single Gas-X pill with every meal.  Please see the "Patient Instructions" section for addition details about the plan.  Owens Loffler, MD [facility] 08/11/2020, 9:16 AM   Total time on date of encounter  was 30 minutes (this included time spent preparing to see the patient reviewing records; obtaining and/or reviewing separately obtained history; performing a medically appropriate exam and/or evaluation; counseling and educating the patient and family if present; ordering medications, tests or procedures if applicable; and documenting clinical information in the health record).

## 2020-08-11 NOTE — Patient Instructions (Addendum)
If you are age 53 or younger, your body mass index should be between 19-25. Your Body mass index is 22.57 kg/m. If this is out of the aformentioned range listed, please consider follow up with your Primary Care Provider.   STOP: Omeprazole   START: Pepcid 20mg  take one tablet three times daily.  We have referred you to Cardiology.  Please contact our office if you had not heard from Cardiology group in 1-2 weeks.    The [facility] providers would like to encourage you to use [facility] to communicate with providers for non-urgent requests or questions.  Due to long hold times on the telephone, sending your provider a message by [facility] may be a faster and more efficient way to get a response.  Please allow 48 business hours for a response.  Please remember that this is for non-urgent requests.   You are scheduled to follow up with our office on 11-01-2020 at 9:50am.   Thank you for entrusting me with your care and choosing [facility].  Dr Ardis Hughs

## 2020-10-23 ENCOUNTER — Ambulatory Visit (INDEPENDENT_AMBULATORY_CARE_PROVIDER_SITE_OTHER): Payer: 59 | Admitting: Cardiology

## 2020-10-23 ENCOUNTER — Encounter: Payer: Self-pay | Admitting: Cardiology

## 2020-10-23 VITALS — BP 140/80 | HR 71 | Ht 66.5 in | Wt 145.8 lb

## 2020-10-23 DIAGNOSIS — R030 Elevated blood-pressure reading, without diagnosis of hypertension: Secondary | ICD-10-CM

## 2020-10-23 DIAGNOSIS — Z8249 Family history of ischemic heart disease and other diseases of the circulatory system: Secondary | ICD-10-CM

## 2020-10-23 DIAGNOSIS — E782 Mixed hyperlipidemia: Secondary | ICD-10-CM

## 2020-10-23 DIAGNOSIS — E785 Hyperlipidemia, unspecified: Secondary | ICD-10-CM | POA: Insufficient documentation

## 2020-10-23 DIAGNOSIS — R0789 Other chest pain: Secondary | ICD-10-CM | POA: Diagnosis not present

## 2020-10-23 NOTE — Progress Notes (Signed)
Primary Care Provider: Hoyt Koch, MD Cardiologist: Glenetta Hew, MD Electrophysiologist: None  Clinic Note: Chief Complaint  Patient presents with   New Patient (Initial Visit)   Chest Pain    Worse @ night, but can las all day     ===================================  ASSESSMENT/PLAN   Problem List Items Addressed This Visit       Cardiology Problems   Hyperlipidemia, unspecified (Chronic)    Lipids not very well controlled, and she has hypertension as well as family history of CAD.  Since she is having some chest TAVR symptoms we will evaluate with chest pain with a GXT (graduated exercise tolerance Test/treadmill stress test   Pending results, if negative can consider coronary calcium score in the future.  If positive, will probably pursue coronary CTA first, symptoms are definitely concerning.       Relevant Orders   EKG 12-Lead (Completed)   Exercise Tolerance Test   Cardiac Stress Test: Informed Consent Details: Physician/Practitioner Attestation; Transcribe to consent form and obtain patient signature     Other   Chest pressure - Primary    I agree that her symptoms are probably related to GERD basis based on description.  Relatively atypical for cardiac standpoint.  But she does have risk factors of hypertension hyperlipidemia family history.  At least mild to moderate risk.  Plan: Treadmill GXT       Relevant Orders   EKG 12-Lead (Completed)   Exercise Tolerance Test   Cardiac Stress Test: Informed Consent Details: Physician/Practitioner Attestation; Transcribe to consent form and obtain patient signature   Elevated blood-pressure reading without diagnosis of hypertension (Chronic)    I attempted blood pressures, but is also been normal.  We can reassess in follow-up and determine if she needs any additional treatment.  Not currently on any antihypertensive       Relevant Orders   EKG 12-Lead (Completed)   Exercise Tolerance Test    Cardiac Stress Test: Informed Consent Details: Physician/Practitioner Attestation; Transcribe to consent form and obtain patient signature   Family history of coronary artery disease (Chronic)    She does have family history of CAD with hypertension hyperlipidemia and now chest pain. Plan: GXT to assess for symptoms, but pending results likely pursue further studying.  -> If no evidence of ischemia on GXT or concerning symptoms with but then checked coronary CTA, otherwise would simply recommend coronary calcium score       Relevant Orders   Cardiac Stress Test: Informed Consent Details: Physician/Practitioner Attestation; Transcribe to consent form and obtain patient signature    . ===================================  HPI:    Sheila Carter is a 55 y.o. female with a PMH notable for Hypertension, Hyperlipidemia and borderline Family History (not premature) of CAD who is being seen today for the evaluation of CHEST PAIN/PRESSURE at the request of Milus Banister, MD for Hoyt Koch, MD  Serafina Mitchell Forinash was seen on Apr 26, 2020 by Dr. Sharlet Salina for evaluation of chest pressure radiating to the left shoulder.  Symptoms of belching.  Not made worse with exertion.  Worse at night, last most of the day.  Nothing with exertion. -> Started on Dexilant and referred to Gastroenterology.  She was seen in consultation by Dr. Ardis Hughs, and has been prescribed PPIs, but was referred to cardiology evaluation just to exclude any symptoms.  Recent Hospitalizations: None  Reviewed  CV studies:    The following studies were reviewed today: (if available, images/films reviewed: From  Epic Chart or Care Everywhere) None:   Interval History:   Sheila Carter says that she has had a history of GERD for a while.  But now she is also having almost daily chest discomfort.  It is coming gone if she stops her Pepcid, it will pick up within a week or so and get very  significant.  She says the symptoms does not necessarily worse with exertion, but can happen with rest or exertion.  Initially things that she describes her symptoms being somewhat on the left upper chest as opposed to the center of her chest. The symptoms mostly occur in the evening, worse when lying down often associated with, but can occur the entire day intermittently without eating.  CV Review of Symptoms (Summary) Cardiovascular ROS: positive for - chest pain and occasional rare palpitations she feels a tingling sensation.  Heart rate goes up with anxiety only. negative for - dyspnea on exertion, orthopnea, paroxysmal nocturnal dyspnea, shortness of breath, or lightheadedness, dizziness or wooziness, syncope/near syncope or TIA/amaurosis fugax symptoms, claudication   REVIEWED OF SYSTEMS   Review of Systems  Constitutional:  Positive for malaise/fatigue (More easily tired). Negative for weight loss.  HENT:  Negative for congestion.   Respiratory:  Negative for cough, shortness of breath and wheezing.   Cardiovascular:  Negative for claudication.  Gastrointestinal:  Positive for abdominal pain and heartburn. Negative for blood in stool.  Genitourinary:  Negative for frequency and hematuria.  Musculoskeletal:  Negative for joint pain.  Neurological:  Positive for dizziness. Negative for focal weakness and weakness.  Psychiatric/Behavioral:  Negative for depression and memory loss. The patient is not nervous/anxious.    I have reviewed and (if needed) personally updated the patient's problem list, medications, allergies, past medical and surgical history, social and family history.   PAST MEDICAL HISTORY   Past Medical History:  Diagnosis Date   Abnormal pap    Anal fissure    Anemia    Blood transfusion without reported diagnosis    Gallbladder disease 2010   GERD (gastroesophageal reflux disease)    Melanoma ([place]) 2002   Thyroid nodule     PAST SURGICAL HISTORY   Past  Surgical History:  Procedure Laterality Date   [place]  2010   NOSE SURGERY  1992   UPPER GASTROINTESTINAL ENDOSCOPY  05/30/2020   [facility]    Immunization History  Administered Date(s) Administered   Influenza,inj,Quad PF,6+ Mos 03/09/2018, 12/16/2018   Influenza-Unspecified 11/30/2019   PFIZER(Purple Top)SARS-COV-2 Vaccination 06/11/2019, 06/25/2019, 02/27/2020   Td 03/26/2003   Tdap 03/25/2008, 03/24/2018    MEDICATIONS/ALLERGIES   Current Meds  Medication Sig   clobetasol cream (TEMOVATE) [id number] % Apply 1 application topically as needed.   estradiol (ESTRACE VAGINAL) 0.1 MG/GM vaginal cream Daily for 1 month, then twice a week ongoing.   famotidine (PEPCID) 20 MG tablet Take 1 tablet (20 mg total) by mouth in the morning, at noon, and at bedtime.   ibandronate (BONIVA) 150 MG tablet Take 150 mg by mouth every 30 (thirty) days.   metroNIDAZOLE (METROGEL) 1 % gel Apply 1 application topically daily.   Current Facility-Administered Medications for the 10/23/20 encounter (Office Visit) with Leonie Man, MD  Medication   0.9 %  sodium chloride infusion    Allergies  Allergen Reactions   Sulfonamide Derivatives     REACTION: Rash fever   Lidocaine-Epinephrine Palpitations    States feels like "  my heart stops" , feels dizzy    SOCIAL HISTORY/FAMILY HISTORY   Reviewed in Epic:  Pertinent findings:  Social History   Tobacco Use   Smoking status: Never   Smokeless tobacco: Never  Vaping Use   Vaping Use: Never used  Substance Use Topics   Alcohol use: Yes    Alcohol/week: 0.0 standard drinks    Comment: occ   Drug use: No   Social History   Social History Narrative   Not on file    OBJCTIVE -PE, EKG, labs   Wt Readings from Last 3 Encounters:  10/23/20 145 lb 12.8 oz (66.1 kg)  08/11/20 143 lb (64.9 kg)  05/30/20 145 lb (65.8 kg)    Physical Exam: BP 140/80 (BP Location: Right Arm)    Pulse 71   Ht 5' 6.5" (1.689 m)   Wt 145 lb 12.8 oz (66.1 kg)   SpO2 99%   BMI 23.18 kg/m  Physical Exam Vitals reviewed.  Constitutional:      General: She is not in acute distress.    Appearance: Normal appearance. She is normal weight. She is not ill-appearing or toxic-appearing.  HENT:     Head: Normocephalic and atraumatic.  Neck:     Vascular: No carotid bruit or JVD.  Cardiovascular:     Rate and Rhythm: Normal rate and regular rhythm. No extrasystoles are present.    Chest Wall: PMI is not displaced.     Pulses: Normal pulses.     Heart sounds: Normal heart sounds. No murmur heard.   No friction rub. No gallop.  Pulmonary:     Effort: Pulmonary effort is normal. No respiratory distress.     Breath sounds: Normal breath sounds. No wheezing, rhonchi or rales.  Chest:     Chest wall: No tenderness.  Abdominal:     General: Bowel sounds are normal. There is no distension.     Palpations: Abdomen is soft. There is no mass.     Tenderness: There is no abdominal tenderness.  Musculoskeletal:     Cervical back: Normal range of motion and neck supple.  Neurological:     General: No focal deficit present.     Mental Status: She is alert and oriented to person, place, and time.  Psychiatric:        Mood and Affect: Mood normal.        Behavior: Behavior normal.        Thought Content: Thought content normal.        Judgment: Judgment normal.     Adult ECG Report  Rate: 71 ;  Rhythm: normal sinus rhythm and normal axis, intervals and durations. ;   Narrative Interpretation: Normal  Recent Labs: No recent. Lab Results  Component Value Date   CHOL 228 (H) 02/17/2019   HDL 65.60 02/17/2019   LDLCALC 154 (H) 02/17/2019   TRIG 44.0 02/17/2019   CHOLHDL 3 02/17/2019   Lab Results  Component Value Date   CREATININE 0.80 02/17/2019   BUN 14 02/17/2019   NA 140 02/17/2019   K 3.8 02/17/2019   CL 103 02/17/2019   CO2 29 02/17/2019   CBC Latest Ref Rng & Units  02/17/2019 03/09/2018 05/06/2016  WBC 4.0 - 10.5 K/uL 7.3 4.7 5.9  Hemoglobin 12.0 - 15.0 g/dL 13.3 12.9 13.1  Hematocrit 36.0 - 46.0 % 39.9 38.4 39.1  Platelets 150.0 - 400.0 K/uL 251.0 211.0 264.0    Lab Results  Component Value Date   TSH 2.90  02/17/2019    ==================================================  COVID-19 Education: The signs and symptoms of COVID-19 were discussed with the patient and how to seek care for testing (follow up with PCP or arrange E-visit).    I spent a total of 34 minutes with the patient spent in direct patient consultation.  Additional time spent with chart review  / charting (studies, outside notes, etc): 18 min Total Time: 52 min  Current medicines are reviewed at length with the patient today.  (+/- concerns) n/a little change in  This visit occurred during the SARS-CoV-2 public health emergency.  Safety protocols were in place, including screening questions prior to the visit, additional usage of staff PPE, and extensive cleaning of exam room while observing appropriate contact time as indicated for disinfecting solutions.  Notice: This dictation was prepared with Dragon dictation along with smaller phrase technology. Any transcriptional errors that result from this process are unintentional and may not be corrected upon review.  Patient Instructions / Medication Changes & Studies & Tests Ordered   Shared Decision Making/Informed Consent The risks [chest pain, shortness of breath, cardiac arrhythmias, dizziness, blood pressure fluctuations, myocardial infarction, stroke/transient ischemic attack, and life-threatening complications (estimated to be 1 in 10,000)], benefits (risk stratification, diagnosing coronary artery disease, treatment guidance) and alternatives of an exercise tolerance test were discussed in detail with Ms. Arment and she agrees to proceed.   Patient Instructions  Medication Instructions:   Continue same  medications    Lab Work:  None ordered   Testing/Procedures:  Treadmill ( POET )   Follow-Up: At [facility], you and your health needs are our priority.  As part of our continuing mission to provide you with exceptional heart care, we have created designated Provider Care Teams.  These Care Teams include your primary Cardiologist (physician) and Advanced Practice Providers (APPs -  Physician Assistants and Nurse Practitioners) who all work together to provide you with the care you need, when you need it.  We recommend signing up for the patient portal called "MyChart".  Sign up information is provided on this After Visit Summary.  MyChart is used to connect with patients for Virtual Visits (Telemedicine).  Patients are able to view lab/test results, encounter notes, upcoming appointments, etc.  Non-urgent messages can be sent to your provider as well.   To learn more about what you can do with MyChart, go to [email or web address].     Your next appointment:  After treadmill   The format for your next appointment: Virtual    Provider:  Dr.Chevie Birkhead    Studies Ordered:   Orders Placed This Encounter  Procedures   Cardiac Stress Test: Informed Consent Details: Physician/Practitioner Attestation; Transcribe to consent form and obtain patient signature   Exercise Tolerance Test   EKG 12-Lead     Glenetta Hew, M.D., M.S. Interventional Cardiologist   Pager # [phone] Phone # [phone] [address] [place], [place] [address]   Thank you for choosing Heartcare at [facility]!!

## 2020-10-23 NOTE — Patient Instructions (Signed)
Medication Instructions:   Continue same medications    Lab Work:  None ordered   Testing/Procedures:  Treadmill Armed forces logistics/support/administrative officer )   Follow-Up: At [facility], you and your health needs are our priority.  As part of our continuing mission to provide you with exceptional heart care, we have created designated Provider Care Teams.  These Care Teams include your primary Cardiologist (physician) and Advanced Practice Providers (APPs -  Physician Assistants and Nurse Practitioners) who all work together to provide you with the care you need, when you need it.  We recommend signing up for the patient portal called "MyChart".  Sign up information is provided on this After Visit Summary.  MyChart is used to connect with patients for Virtual Visits (Telemedicine).  Patients are able to view lab/test results, encounter notes, upcoming appointments, etc.  Non-urgent messages can be sent to your provider as well.   To learn more about what you can do with MyChart, go to [email or web address].     Your next appointment:  After treadmill   The format for your next appointment: Virtual    Provider:  Dr.Harding

## 2020-10-24 ENCOUNTER — Telehealth: Payer: Self-pay | Admitting: *Deleted

## 2020-10-24 ENCOUNTER — Encounter: Payer: Self-pay | Admitting: Cardiology

## 2020-10-24 NOTE — Telephone Encounter (Signed)
A message was left,re: patient needs to schedule a follow up visit.

## 2020-10-24 NOTE — Assessment & Plan Note (Signed)
I attempted blood pressures, but is also been normal.  We can reassess in follow-up and determine if she needs any additional treatment.  Not currently on any antihypertensive

## 2020-10-24 NOTE — Assessment & Plan Note (Signed)
She does have family history of CAD with hypertension hyperlipidemia and now chest pain. Plan: GXT to assess for symptoms, but pending results likely pursue further studying.  -> If no evidence of ischemia on GXT or concerning symptoms with but then checked coronary CTA, otherwise would simply recommend coronary calcium score

## 2020-10-24 NOTE — Assessment & Plan Note (Signed)
I agree that her symptoms are probably related to GERD basis based on description.  Relatively atypical for cardiac standpoint.  But she does have risk factors of hypertension hyperlipidemia family history.  At least mild to moderate risk.  Plan: Treadmill GXT

## 2020-10-24 NOTE — Assessment & Plan Note (Signed)
Lipids not very well controlled, and she has hypertension as well as family history of CAD.  Since she is having some chest TAVR symptoms we will evaluate with chest pain with a GXT (graduated exercise tolerance Test/treadmill stress test   Pending results, if negative can consider coronary calcium score in the future.  If positive, will probably pursue coronary CTA first, symptoms are definitely concerning.

## 2020-11-01 ENCOUNTER — Encounter: Payer: Self-pay | Admitting: Gastroenterology

## 2020-11-01 ENCOUNTER — Ambulatory Visit (INDEPENDENT_AMBULATORY_CARE_PROVIDER_SITE_OTHER): Payer: 59 | Admitting: Gastroenterology

## 2020-11-01 VITALS — BP 100/70 | HR 78 | Ht 66.5 in | Wt 146.0 lb

## 2020-11-01 DIAGNOSIS — R1013 Epigastric pain: Secondary | ICD-10-CM

## 2020-11-01 MED ORDER — FAMOTIDINE 20 MG PO TABS: 20.0000 mg | ORAL_TABLET | Freq: Two times a day (BID) | ORAL | 0 refills | Status: AC

## 2020-11-01 NOTE — Patient Instructions (Addendum)
If you are age 53 or younger, your body mass index should be between 19-25. Your Body mass index is 23.21 kg/m. If this is out of the aformentioned range listed, please consider follow up with your Primary Care Provider.   __________________________________________________________  The [facility] providers would like to encourage you to use [facility] to communicate with providers for non-urgent requests or questions.  Due to long hold times on the telephone, sending your provider a message by [facility] may be a faster and more efficient way to get a response.  Please allow 48 business hours for a response.  Please remember that this is for non-urgent requests.   RESTART: Pepcid '20mg'$  one tablet in the morning and one at night.  Take one GAS-X every morning.  Please discontinue carbonated beverages.  Please call our office or send a MyChart message in 5 to 6 weeks to report on how you are feeling.  Thank you for entrusting me with your care and choosing [facility].  Dr Ardis Hughs

## 2020-11-01 NOTE — Progress Notes (Signed)
Review of pertinent gastrointestinal problems: 1.  Routine risk for colon cancer.  Colonoscopy September 2020 found a single subcentimeter polyp which was not precancerous.  I recommended repeat colonoscopy at 10-year interval.  Also noted internal hemorrhoids at that time. 2.  Atypical chest pains, chronic GERD symptoms led to EGD March 2022.  Mild gastritis was noted, biopsies showed no sign of H. pylori.  She was recommended to take proton pump inhibitor twice daily shortly for meals and continue Pepcid at bedtime only.  May 2022 change to H2 blocker only, 3 times daily.  HPI: This is a very pleasant 55 year old Carter  Last saw her here in the office about 2 and half months ago.  I did feel that she probably had gastrointestinal related chest discomforts, dyspepsia.  I referred her to a cardiologist to exclude potential cardiac disease.  She is scheduled for stress test in a week or 2.  At the time of that visit I also recommended she stop taking proton pump inhibitors and instead take Pepcid 20 mg pills 3 times daily.  Her weight is up 3 pounds since her last office visit here 2 and half months ago  She tried 3 times daily Pepcid felt good for about 10 days and then she started feeling poorly again especially with intermittent left chest pressure is relieved with belching.  She completely stopped Pepcid and was better for a few days and then again started feeling poorly.  She has not had no bowel issues with any of this upper GI symptoms.  She tells me today she has been battling this on and off for 8 or 10 years.  She rarely takes NSAIDs.  She drinks carbonated beverages every once a while and says this actually helps.  She is strictly gluten-free, has never been proven to actually have celiac sprue however.   ROS: complete GI ROS as described in HPI, all other review negative.  Constitutional:  No unintentional weight loss   Past Medical History:  Diagnosis Date   Abnormal pap     Anal fissure    Anemia    Blood transfusion without reported diagnosis    Gallbladder disease 2010   GERD (gastroesophageal reflux disease)    Melanoma ([place]) 2002   Thyroid nodule     Past Surgical History:  Procedure Laterality Date   [place]  2010   NOSE SURGERY  1992   UPPER GASTROINTESTINAL ENDOSCOPY  05/30/2020   [facility]    Current Outpatient Medications  Medication Sig Dispense Refill   clobetasol cream (TEMOVATE) [id number] % Apply 1 application topically as needed.     estradiol (ESTRACE VAGINAL) 0.1 MG/GM vaginal cream Daily for 1 month, then twice a week ongoing. Sheila.5 g 12   ibandronate (BONIVA) 150 MG tablet Take 150 mg by mouth every 30 (thirty) days.     metroNIDAZOLE (METROGEL) 1 % gel Apply 1 application topically daily.     famotidine (PEPCID) 20 MG tablet Take 1 tablet (20 mg total) by mouth in the morning, at noon, and at bedtime. (Patient not taking: Reported on 11/01/2020) 90 tablet 3   Current Facility-Administered Medications  Medication Dose Route Frequency Provider Last Rate Last Admin   0.9 %  sodium chloride infusion  500 mL Intravenous Once Milus Banister, MD        Allergies as of 11/01/2020 - Review Complete 11/01/2020  Allergen Reaction Noted   Sulfonamide derivatives  Lidocaine-epinephrine Palpitations 05/30/2020    Family History  Problem Relation Age of Onset   Hypothyroidism Mother    Diverticulosis Mother    Hypertension Mother 93   Heart attack Mother 43   Hyperlipidemia Mother 12   Nephrolithiasis Father    Heart disease Father        CHF   Hypertension Father    Heart attack Father 4   Coronary artery disease Father 58       PCI   Kidney disease Father    Hyperlipidemia Father 26   Gallbladder disease Sister        #1   Hypertension Sister    Gallbladder disease Sister        #2   Hypertension Sister    Heart attack Maternal Grandmother 78   Diverticulosis  Maternal Grandfather    Dementia Paternal Grandfather    Colon cancer Neg Hx    Colon polyps Neg Hx    Esophageal cancer Neg Hx    Rectal cancer Neg Hx    Stomach cancer Neg Hx     Social History   Socioeconomic History   Marital status: Divorced    Spouse name: Not on file   Number of children: 2   Years of education: Not on file   Highest education level: Not on file  Occupational History   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  Tobacco Use   Smoking status: Never   Smokeless tobacco: Never  Vaping Use   Vaping Use: Never used  Substance and Sexual Activity   Alcohol use: Yes    Alcohol/week: 0.0 standard drinks    Comment: occ   Drug use: No   Sexual activity: Not Currently  Other Topics Concern   Not on file  Social History Narrative   Not on file   Social Determinants of Health   Financial Resource Strain: Not on file  Food Insecurity: Not on file  Transportation Needs: Not on file  Physical Activity: Not on file  Stress: Not on file  Social Connections: Not on file  Intimate Partner Violence: Not on file     Physical Exam: BP 100/70   Pulse 78   Ht 5' 6.5" (1.689 m)   Wt 146 lb (66.2 kg)   BMI 23.21 kg/m  Constitutional: generally well-appearing Psychiatric: alert and oriented x3 Abdomen: soft, nontender, nondistended, no obvious ascites, no peritoneal signs, normal bowel sounds No peripheral edema noted in lower extremities  Assessment and plan: 55 y.o. 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  That is happening next week.  Second I explained to her that I think it is extremely unlikely there is anything serious going on.  She had an EGD, see those results above.  Her symptoms are really mainly have belching and dyspepsia.  Perhaps GERD is playing somewhat of a role however we have had mixed results with  antiacid medicines in her case.  I recommended she start taking her Pepcid only twice daily, shortly after she wakes up and then also at bedtime.  She will start taking a Gas-X pill with every morning Pepcid.  I also recommended she try to completely avoid carbonated beverages.  She will call to update on her symptoms in 5 or 6 weeks.  No further testing for now.  Please see the "Patient Instructions" section for addition details about  the plan.  Owens Loffler, MD [facility] 11/01/2020, 9:58 AM   Total time on date of encounter was 25 minutes (this included time spent preparing to see the patient reviewing records; obtaining and/or reviewing separately obtained history; performing a medically appropriate exam and/or evaluation; counseling and educating the patient and family if present; ordering medications, tests or procedures if applicable; and documenting clinical information in the health record).

## 2020-11-02 ENCOUNTER — Telehealth (HOSPITAL_COMMUNITY): Payer: Self-pay | Admitting: *Deleted

## 2020-11-02 NOTE — Telephone Encounter (Signed)
Close encounter 

## 2020-11-07 ENCOUNTER — Other Ambulatory Visit: Payer: Self-pay

## 2020-11-07 ENCOUNTER — Ambulatory Visit (HOSPITAL_COMMUNITY)
Admission: RE | Admit: 2020-11-07 | Discharge: 2020-11-07 | Disposition: A | Discharge status: Home / Self Care | Payer: 59 | Source: Ambulatory Visit | Attending: Cardiology | Admitting: Cardiology

## 2020-11-07 DIAGNOSIS — R030 Elevated blood-pressure reading, without diagnosis of hypertension: Secondary | ICD-10-CM | POA: Diagnosis present

## 2020-11-07 DIAGNOSIS — E782 Mixed hyperlipidemia: Secondary | ICD-10-CM | POA: Diagnosis present

## 2020-11-07 DIAGNOSIS — R0789 Other chest pain: Secondary | ICD-10-CM | POA: Diagnosis present

## 2020-11-07 LAB — EXERCISE TOLERANCE TEST
Estimated workload: 10.1 METS
Exercise duration (min): 9 min
Exercise duration (sec): 0 s
MPHR: 167 {beats}/min
Peak HR: 173 {beats}/min
Percent HR: 103 %
Rest HR: 83 {beats}/min

## 2020-11-08 ENCOUNTER — Telehealth: Payer: Self-pay | Admitting: *Deleted

## 2020-11-08 NOTE — Telephone Encounter (Signed)
-----   Message from Sanda Klein, MD sent at 11/07/2020  7:33 PM EDT ----- Normal ECG stress test

## 2020-11-08 NOTE — Telephone Encounter (Signed)
The patient has been notified of the result and verbalized understanding.  All questions (if any) were answered. Patient states DR Ellyn Hack mention she may need further test ie ( cardiac scoring) she would like to know if she needs further test before scheduling  follow up virtual visit . RN informed patient will defer to Dr Ellyn Hack and call her back. Patient is aware Dr Ellyn Hack is out of the office . Rn will contact her when question is answered.  Raiford Simmonds, RN 11/08/2020 11:32 AM

## 2020-11-24 ENCOUNTER — Other Ambulatory Visit: Payer: Self-pay | Admitting: Obstetrics and Gynecology

## 2020-11-24 DIAGNOSIS — Z9189 Other specified personal risk factors, not elsewhere classified: Secondary | ICD-10-CM

## 2020-12-04 ENCOUNTER — Other Ambulatory Visit: Payer: Self-pay | Admitting: Obstetrics and Gynecology

## 2020-12-04 DIAGNOSIS — Z9189 Other specified personal risk factors, not elsewhere classified: Secondary | ICD-10-CM

## 2020-12-23 ENCOUNTER — Other Ambulatory Visit: Payer: Self-pay

## 2020-12-23 ENCOUNTER — Ambulatory Visit
Admission: RE | Admit: 2020-12-23 | Discharge: 2020-12-23 | Disposition: A | Discharge status: Home / Self Care | Payer: 59 | Source: Ambulatory Visit | Attending: Obstetrics and Gynecology | Admitting: Obstetrics and Gynecology

## 2020-12-23 DIAGNOSIS — Z9189 Other specified personal risk factors, not elsewhere classified: Secondary | ICD-10-CM

## 2020-12-23 MED ORDER — GADOBUTROL 1 MMOL/ML IV SOLN
6.0000 mL | Freq: Once | INTRAVENOUS | Status: AC | PRN
  Administered 2020-12-23: 6 mL via INTRAVENOUS

## 2021-08-16 ENCOUNTER — Ambulatory Visit: Payer: 59 | Admitting: Internal Medicine

## 2021-08-17 NOTE — Telephone Encounter (Signed)
Note not needed 

## 2021-08-23 ENCOUNTER — Ambulatory Visit (INDEPENDENT_AMBULATORY_CARE_PROVIDER_SITE_OTHER): Payer: 59 | Admitting: Internal Medicine

## 2021-08-23 ENCOUNTER — Ambulatory Visit (INDEPENDENT_AMBULATORY_CARE_PROVIDER_SITE_OTHER): Payer: 59

## 2021-08-23 ENCOUNTER — Encounter: Payer: Self-pay | Admitting: Internal Medicine

## 2021-08-23 VITALS — BP 112/80 | HR 82 | Resp 18 | Ht 66.5 in | Wt 149.6 lb

## 2021-08-23 DIAGNOSIS — M533 Sacrococcygeal disorders, not elsewhere classified: Secondary | ICD-10-CM

## 2021-08-23 NOTE — Progress Notes (Signed)
   Subjective:   Patient ID: Sheila Carter, female    DOB: February 06, 1966, 56 y.o.   MRN: [id number]  HPI The patient is a 56 YO female coming in for pain in coccyx area.   Review of Systems  Constitutional: Negative.   HENT: Negative.    Eyes: Negative.   Respiratory:  Negative for cough, chest tightness and shortness of breath.   Cardiovascular:  Negative for chest pain, palpitations and leg swelling.  Gastrointestinal:  Negative for abdominal distention, abdominal pain, constipation, diarrhea, nausea and vomiting.  Musculoskeletal:  Positive for myalgias.  Skin: Negative.   Neurological: Negative.   Psychiatric/Behavioral: Negative.     Objective:  Physical Exam Constitutional:      Appearance: She is well-developed.  HENT:     Head: Normocephalic and atraumatic.  Cardiovascular:     Rate and Rhythm: Normal rate and regular rhythm.  Pulmonary:     Effort: Pulmonary effort is normal. No respiratory distress.     Breath sounds: Normal breath sounds. No wheezing or rales.  Abdominal:     General: Bowel sounds are normal. There is no distension.     Palpations: Abdomen is soft.     Tenderness: There is no abdominal tenderness. There is no rebound.  Musculoskeletal:        General: Tenderness present.     Cervical back: Normal range of motion.  Skin:    General: Skin is warm and dry.  Neurological:     Mental Status: She is alert and oriented to person, place, and time.     Coordination: Coordination normal.    Vitals:   08/23/21 0817  BP: 112/80  Pulse: 82  Resp: 18  SpO2: 99%  Weight: 149 lb 9.6 oz (67.9 kg)  Height: 5' 6.5" (1.689 m)    Assessment & Plan:

## 2021-08-23 NOTE — Assessment & Plan Note (Signed)
Ordered x-ray coccyx to evaluate for fracture. If normal will do 1-2 weeks of NSAID scheduled to help with pan. Pain is mostly lasting seconds when standing after sitting awhile.

## 2022-12-13 ENCOUNTER — Ambulatory Visit: Payer: 59 | Admitting: Internal Medicine

## 2023-01-28 ENCOUNTER — Ambulatory Visit: Payer: 59 | Admitting: Internal Medicine

## 2023-01-28 ENCOUNTER — Encounter: Payer: Self-pay | Admitting: Internal Medicine

## 2023-01-28 VITALS — BP 120/85 | HR 84 | Temp 97.6°F | Ht 66.5 in | Wt 152.0 lb

## 2023-01-28 DIAGNOSIS — R030 Elevated blood-pressure reading, without diagnosis of hypertension: Secondary | ICD-10-CM | POA: Diagnosis not present

## 2023-01-28 DIAGNOSIS — R5383 Other fatigue: Secondary | ICD-10-CM

## 2023-01-28 DIAGNOSIS — E782 Mixed hyperlipidemia: Secondary | ICD-10-CM

## 2023-01-28 DIAGNOSIS — Z8249 Family history of ischemic heart disease and other diseases of the circulatory system: Secondary | ICD-10-CM | POA: Diagnosis not present

## 2023-01-28 LAB — CBC
HCT: 41.8 % (ref 36.0–46.0)
Hemoglobin: 13.3 g/dL (ref 12.0–15.0)
MCHC: 31.8 g/dL (ref 30.0–36.0)
MCV: 89.7 fL (ref 78.0–100.0)
Platelets: 239 10*3/uL (ref 150.0–400.0)
RBC: 4.66 Mil/uL (ref 3.87–5.11)
RDW: 12.4 % (ref 11.5–15.5)
WBC: 3.9 10*3/uL — ABNORMAL LOW (ref 4.0–10.5)

## 2023-01-28 LAB — COMPREHENSIVE METABOLIC PANEL
ALT: 19 U/L (ref 0–35)
AST: 21 U/L (ref 0–37)
Albumin: 4.3 g/dL (ref 3.5–5.2)
Alkaline Phosphatase: 101 U/L (ref 39–117)
BUN: 8 mg/dL (ref 6–23)
CO2: 29 meq/L (ref 19–32)
Calcium: 9.6 mg/dL (ref 8.4–10.5)
Chloride: 106 meq/L (ref 96–112)
Creatinine, Ser: 0.83 mg/dL (ref 0.40–1.20)
GFR: 79.29 mL/min (ref >60.00)
Glucose, Bld: 91 mg/dL (ref 70–99)
Potassium: 3.8 meq/L (ref 3.5–5.1)
Sodium: 142 meq/L (ref 135–145)
Total Bilirubin: 0.5 mg/dL (ref 0.2–1.2)
Total Protein: 7.8 g/dL (ref 6.0–8.3)

## 2023-01-28 LAB — LIPID PANEL
Cholesterol: 175 mg/dL (ref 0–200)
HDL: 49.9 mg/dL (ref >39.00)
LDL Cholesterol: 106 mg/dL — ABNORMAL HIGH (ref 0–99)
NonHDL: 124.92
Total CHOL/HDL Ratio: 4
Triglycerides: 96 mg/dL (ref 0.0–149.0)
VLDL: 19.2 mg/dL (ref 0.0–40.0)

## 2023-01-28 LAB — VITAMIN B12: Vitamin B-12: 468 pg/mL (ref 211–911)

## 2023-01-28 LAB — TSH: TSH: 1.92 u[IU]/mL (ref 0.35–5.50)

## 2023-01-28 LAB — VITAMIN D 25 HYDROXY (VIT D DEFICIENCY, FRACTURES): VITD: 35.97 ng/mL (ref 30.00–100.00)

## 2023-01-28 NOTE — Patient Instructions (Signed)
We will get the labs today and the calcium score to assess the plaque in the heart.

## 2023-01-28 NOTE — Progress Notes (Signed)
   Subjective:   Patient ID: Sheila Carter, female    DOB: 04-28-1965, 57 y.o.   MRN: [id number]  HPI The patient is a 57 YO female coming in for getting winded when picking up things and doing stairs (multiple flights). She has not been as active in the last year. Now working to increase activity. Denies chest pains.  Review of Systems  Constitutional: Negative.   HENT: Negative.    Eyes: Negative.   Respiratory:  Positive for shortness of breath. Negative for cough and chest tightness.   Cardiovascular:  Negative for chest pain, palpitations and leg swelling.  Gastrointestinal:  Negative for abdominal distention, abdominal pain, constipation, diarrhea, nausea and vomiting.  Musculoskeletal: Negative.   Skin: Negative.   Neurological: Negative.   Psychiatric/Behavioral: Negative.      Objective:  Physical Exam Constitutional:      Appearance: She is well-developed.  HENT:     Head: Normocephalic and atraumatic.  Cardiovascular:     Rate and Rhythm: Normal rate and regular rhythm.  Pulmonary:     Effort: Pulmonary effort is normal. No respiratory distress.     Breath sounds: Normal breath sounds. No wheezing or rales.  Abdominal:     General: Bowel sounds are normal. There is no distension.     Palpations: Abdomen is soft.     Tenderness: There is no abdominal tenderness. There is no rebound.  Musculoskeletal:     Cervical back: Normal range of motion.  Skin:    General: Skin is warm and dry.  Neurological:     Mental Status: She is alert and oriented to person, place, and time.     Coordination: Coordination normal.     Vitals:   01/28/23 1023  BP: 120/85  Pulse: 84  Temp: 97.6 F (36.4 C)  TempSrc: Oral  SpO2: 100%  Weight: 152 lb (68.9 kg)  Height: 5' 6.5" (1.689 m)    Assessment & Plan:

## 2023-01-30 ENCOUNTER — Ambulatory Visit: Payer: 59 | Admitting: Internal Medicine

## 2023-01-31 DIAGNOSIS — R5383 Other fatigue: Secondary | ICD-10-CM | POA: Insufficient documentation

## 2023-01-31 NOTE — Assessment & Plan Note (Signed)
Given new exertional dyspnea will check calcium score. Suspect this is due to deconditioning.

## 2023-01-31 NOTE — Assessment & Plan Note (Signed)
BP at goal today and has been controlled with diet and exercise. Continue close monitoring at every visit.

## 2023-01-31 NOTE — Assessment & Plan Note (Signed)
Checking lipid panel and adjust as needed. Not on medication and is more active in the last few months.

## 2023-01-31 NOTE — Assessment & Plan Note (Signed)
Exertional fatigue and checking calcium score. Checking CBC, CMP, vitamin D, B12, TSH.

## 2023-02-14 ENCOUNTER — Ambulatory Visit (HOSPITAL_COMMUNITY)
Admission: RE | Admit: 2023-02-14 | Discharge: 2023-02-14 | Disposition: A | Discharge status: Home / Self Care | Payer: 59 | Source: Ambulatory Visit | Attending: Internal Medicine | Admitting: Internal Medicine

## 2023-02-14 DIAGNOSIS — Z8249 Family history of ischemic heart disease and other diseases of the circulatory system: Secondary | ICD-10-CM | POA: Insufficient documentation

## 2023-02-17 ENCOUNTER — Ambulatory Visit: Payer: 59 | Attending: General Practice | Admitting: General Practice

## 2023-02-17 ENCOUNTER — Encounter: Payer: Self-pay | Admitting: Internal Medicine

## 2023-02-17 ENCOUNTER — Encounter: Payer: Self-pay | Admitting: General Practice

## 2023-02-17 ENCOUNTER — Other Ambulatory Visit: Payer: Self-pay | Admitting: Internal Medicine

## 2023-02-17 ENCOUNTER — Telehealth: Payer: Self-pay | Admitting: Cardiology

## 2023-02-17 VITALS — BP 115/60 | HR 82 | Ht 66.0 in | Wt 152.0 lb

## 2023-02-17 DIAGNOSIS — R030 Elevated blood-pressure reading, without diagnosis of hypertension: Secondary | ICD-10-CM

## 2023-02-17 DIAGNOSIS — R002 Palpitations: Secondary | ICD-10-CM

## 2023-02-17 DIAGNOSIS — R931 Abnormal findings on diagnostic imaging of heart and coronary circulation: Secondary | ICD-10-CM | POA: Diagnosis not present

## 2023-02-17 DIAGNOSIS — R42 Dizziness and giddiness: Secondary | ICD-10-CM

## 2023-02-17 MED ORDER — ATORVASTATIN CALCIUM 20 MG PO TABS: 20.0000 mg | ORAL_TABLET | Freq: Every day | ORAL | 3 refills | Status: DC

## 2023-02-17 MED ORDER — METOPROLOL TARTRATE 100 MG PO TABS: 100.0000 mg | ORAL_TABLET | Freq: Once | ORAL | 0 refills | Status: AC

## 2023-02-17 MED ORDER — METOPROLOL TARTRATE 100 MG PO TABS: 100.0000 mg | ORAL_TABLET | Freq: Once | ORAL | 3 refills | Status: DC

## 2023-02-17 NOTE — Telephone Encounter (Signed)
Called patient and she wants to be seen for dizziness and rapid heart rate. Appointment scheduled for today with Edd Fabian, NP.

## 2023-02-17 NOTE — Patient Instructions (Addendum)
Medication Instructions:  START ATORVASTATIN 20MG  DAILY  *If you need a refill on your cardiac medications before your next appointment, please call your pharmacy*  Lab Work: CBC AND BMET 2 DAYS BEFORE CT -AND- FASTING LIPID AND LFT IN 8 WEEKS If you have labs (blood work) drawn today and your tests are completely normal, you will receive your results only by:  MyChart Message (if you have MyChart) OR  A paper copy in the mail If you have any lab test that is abnormal or we need to change your treatment, we will call you to review the results.  Testing/Procedures: CORONARY CTA-SEE BELOW  Follow-Up: At [facility], you and your health needs are our priority.  As part of our continuing mission to provide you with exceptional heart care, we have created designated Provider Care Teams.  These Care Teams include your primary Cardiologist (physician) and Advanced Practice Providers (APPs -  Physician Assistants and Nurse Practitioners) who all work together to provide you with the care you need, when you need it.  Your next appointment:   AFTER TESTING   Provider:   Bryan Lemma, MD         Your cardiac CT will be scheduled at one of the below locations:   [facility] [address] [place], [place] [address] [phone]  OR  [facility] [address] [place], [place] [address] [phone]  OR   [facility] [address] [place], [place] [address] [phone]  If scheduled at [facility], please arrive at the [facility] and Children's Entrance (Entrance C2) of [facility] 30 minutes prior to test start time. You can use the FREE valet parking offered at entrance C (encouraged to control the heart rate for the test)  Proceed to the [facility] Radiology Department (first floor) to check-in and test prep.  All radiology patients and guests should use entrance C2 at  [facility], accessed from [facility], even though the hospital's physical address listed is [address]    If scheduled at [facility] or [facility], please arrive 15 mins early for check-in and test prep.  There is spacious parking and easy access to the radiology department from the [facility] Heart and Vascular entrance. Please enter here and check-in with the desk attendant.   Please follow these instructions carefully (unless otherwise directed):  An IV will be required for this test and Nitroglycerin will be given.  Hold all erectile dysfunction medications at least 3 days (72 hrs) prior to test. (Ie viagra, cialis, sildenafil, tadalafil, etc)   On the Night Before the Test: Be sure to Drink plenty of water. Do not consume any caffeinated/decaffeinated beverages or chocolate 12 hours prior to your test. Do not take any antihistamines 12 hours prior to your test.   On the Day of the Test: Drink plenty of water until 1 hour prior to the test. Do not eat any food 1 hour prior to test. You may take your regular medications prior to the test.  Take metoprolol (Lopressor) 100MG  two hours prior to test. If you take Furosemide/Hydrochlorothiazide/Spironolactone, please HOLD on the morning of the test. FEMALES- please wear underwire-free bra if available, avoid dresses & tight clothing After the Test: Drink plenty of water. After receiving IV contrast, you may experience a mild flushed feeling. This is normal. On occasion, you may experience a mild rash  up to 24 hours after the test. This is not dangerous. If this occurs, you can take Benadryl 25 mg and increase your fluid intake. If you experience trouble breathing, this can be serious. If it is severe call 911 IMMEDIATELY. If it is mild, please call our office. If you take any of these medications: Glipizide/Metformin, Avandament, Glucavance, please do not take  48 hours after completing test unless otherwise instructed.  We will call to schedule your test 2-4 weeks out understanding that some insurance companies will need an authorization prior to the service being performed.   For more information and frequently asked questions, please visit our website : [email or web address]  For non-scheduling related questions, please contact the cardiac imaging nurse navigator should you have any questions/concerns: Cardiac Imaging Nurse Navigators Direct Office Dial: [phone]   For scheduling needs, including cancellations and rescheduling, please call [place], [phone].    High-Fiber Eating Plan A high fiber diet with plenty of fluids (up to 8 glasses of water daily) is suggested to relieve these symptoms.  Metamucil, 1 tablespoon once or twice daily can be used to keep bowels regular if needed.  Fiber, also called dietary fiber, is found in foods such as fruits, vegetables, whole grains, and beans. A high-fiber diet can be good for your health. Your health care provider may recommend a high-fiber diet to help: Prevent trouble pooping (constipation). Lower your cholesterol. Treat the following conditions: Hemorrhoids. This is inflammation of veins in the anus. Inflammation of specific areas of the digestive tract. Irritable bowel syndrome (IBS). This is a problem of the large intestine, also called the colon, that sometimes causes belly pain and bloating. Prevent overeating as part of a weight-loss plan. Lower the risk of heart disease, type 2 diabetes, and certain cancers. What are tips for following this plan? Reading food labels  Check the nutrition facts label on foods for the amount of dietary fiber. Choose foods that have 4 grams of fiber or more per serving. The recommended goals for how much fiber you should eat each day include: Males 31 years old or younger: 30-34 g. Males over 51 years old: 28-34 g. Females 9 years  old or younger: 25-28 g. Females over 29 years old: 22-25 g. Your daily fiber goal is _____________ g. Shopping Choose whole fruits and vegetables instead of processed. For example, choose apples instead of apple juice or applesauce. Choose a variety of high-fiber foods such as avocados, lentils, oats, and pinto beans. Read the nutrition facts label on foods. Check for foods with added fiber. These foods often have high sugar and salt (sodium) amounts per serving. Cooking Use whole-grain flour for baking and cooking. Cook with brown rice instead of white rice. Make meals that have a lot of beans and vegetables in them, such as chili or vegetable-based soups. Meal planning Start the day with a breakfast that is high in fiber, such as a cereal that has 5 g of fiber or more per serving. Eat breads and cereals that are made with whole-grain flour instead of refined flour or white flour. Eat brown rice, bulgur wheat, or millet instead of white rice. Use beans in place of meat in soups, salads, and pasta dishes. Be sure that half of the grains you eat each day are whole grains. General information You can get the recommended amount of dietary fiber by: Eating a variety of fruits, vegetables, grains, nuts, and beans. Taking a fiber supplement if you aren't able to eat enough fiber. It's better  to get fiber through food than from a supplement. Slowly increase how much fiber you eat. If you increase the amount of fiber you eat too quickly, you may have bloating, cramping, or gas. Drink plenty of water to help you digest fiber. Choose high-fiber snacks, such as berries, raw vegetables, nuts, and popcorn. What foods should I eat? Fruits Berries. Pears. Apples. Oranges. Avocado. Prunes and raisins. Dried figs. Vegetables Sweet potatoes. Spinach. Kale. Artichokes. Cabbage. Broccoli. Cauliflower. Green peas. Carrots. Squash. Grains Whole-grain breads. Multigrain cereal. Oats and oatmeal. Brown rice.  Barley. Bulgur wheat. Millet. Quinoa. Bran muffins. Popcorn. Rye wafer crackers. Meats and other proteins Navy beans, kidney beans, and pinto beans. Soybeans. Split peas. Lentils. Nuts and seeds. Dairy Fiber-fortified yogurt. Fortified means that fiber has been added to the product. Beverages Fiber-fortified soy milk. Fiber-fortified orange juice. Other foods Fiber bars. The items listed above may not be all the foods and drinks you can have. Talk to a dietitian to learn more. What foods should I avoid? Fruits Fruit juice. Cooked, strained fruit. Vegetables Fried potatoes. Canned vegetables. Well-cooked vegetables. Grains White bread. Pasta made with refined flour. White rice. Meats and other proteins Fatty meat. Fried chicken or fried fish. Dairy Milk. Cream cheese. Sour cream. Fats and oils Butters. Beverages Soft drinks. Other foods Cakes and pastries. The items listed above may not be all the foods and drinks you should avoid. Talk to a dietitian to learn more. This information is not intended to replace advice given to you by your health care provider. Make sure you discuss any questions you have with your health care provider. Document Revised: 06/03/2022 Document Reviewed: 06/03/2022 Elsevier Patient Education  2024 [employer].

## 2023-02-17 NOTE — Telephone Encounter (Signed)
Patient would like to do a provider switch from Dr. Herbie Baltimore to Dr. [place]. Patient last seen Dr. Herbie Baltimore in 2022. Would both of you approve the provider switch?

## 2023-02-17 NOTE — Telephone Encounter (Signed)
Pt c/o Shortness Of Breath: STAT if SOB developed within the last 24 hours or pt is noticeably SOB on the phone  1. Are you currently SOB (can you hear that pt is SOB on the phone)? No  2. How long have you been experiencing SOB? Since June, started get bad around Aug/Sept  3. Are you SOB when sitting or when up moving around? Moving around  4. Are you currently experiencing any other symptoms? Dizziness and rapid heart rate

## 2023-02-17 NOTE — Telephone Encounter (Signed)
Called patient with no answer. Left message to return call.

## 2023-02-17 NOTE — Progress Notes (Signed)
Cardiology Clinic Note   Patient Name: Sheila Carter Date of Encounter: 02/17/2023  Primary Care Provider:  Myrlene Broker, MD Primary Cardiologist:  Bryan Lemma, MD  Patient Profile    Sheila Carter 57 year old female presents to the clinic today for follow-up evaluation of her dizziness and accelerated heart rate.  Past Medical History    Past Medical History:  Diagnosis Date   Abnormal pap    Anal fissure    Anemia    Blood transfusion without reported diagnosis    Gallbladder disease 2010   GERD (gastroesophageal reflux disease)    Melanoma (HCC) 2002   Thyroid nodule    Past Surgical History:  Procedure Laterality Date   ADENOIDECTOMY  1980   APPENDECTOMY  1984   GALLBLADDER SURGERY  2010   NOSE SURGERY  1992   UPPER GASTROINTESTINAL ENDOSCOPY  05/30/2020   Rob Bunting    Allergies  Allergies  Allergen Reactions   Sulfonamide Derivatives     REACTION: Rash fever   Lidocaine-Epinephrine (Pf) Palpitations    States feels like "my heart stops" , feels dizzy    History of Present Illness    Sheila Carter has a PMH of GERD, thyroid nodule, HLD, elevated coronary calcium scoring, tinnitus, arthralgia, sacral pain, and fatigue.  Her PMH also includes a family history of coronary disease.  She was seen initially by Dr. Herbie Baltimore 10/23/2020.  During that time she was referred by her PCP for evaluation of chest pressure that was radiating to her left shoulder.  She also had symptoms of belching.  Symptoms were not made worse with exertion.  They were worse at night and lasted most of the day.  She was started on Dexilant and referred to gastroenterology.  Exercise treadmill test was ordered and showed no ST segment deviation.  She was able to exercise for 9 minutes and achieved a peak heart rate of 173.  Stress test was negative.  She had a coronary calcium scoring test done by her PCP 02/14/2023 which showed a score of 107  placing her in the 95th percentile for age race and sex matched controls.  Her left main 0, LAD, 107, circumflex 0, RCA 0.  She was referred back to cardiology for evaluation.  She contacted nurse triage line today and reported dizziness and increased heart rate for the past several months.  She presents to the clinic today for follow-up evaluation and states she was hiking in June and noted with hiking she was getting more short of breath than she expected.  She reports that through the summer she did yard work and also noted increased work of breathing.  She has been walking her dog daily.  She denies a formal exercise routine.  We reviewed her coronary calcium scoring and options for next steps.  I reviewed the importance of high-fiber diet.  She expressed understanding.  I will order coronary CTA and atorvastatin 20 mg daily.  We will repeat her fasting lipids and LFTs in 8 weeks and plan follow-up after testing.  Today she denies lower extremity edema, fatigue, palpitations, melena, hematuria, hemoptysis, diaphoresis, weakness, presyncope, syncope, orthopnea, and PND.   Home Medications    Prior to Admission medications   Medication Sig Start Date End Date Taking? Authorizing Provider  clobetasol cream (TEMOVATE) 0.05 % Apply 1 application topically as needed.    [provider]  famotidine (PEPCID) 20 MG tablet Take 1 tablet (20 mg total) by mouth 2 (two)  times daily. 11/01/20   Rachael Fee, MD  ibandronate (BONIVA) 150 MG tablet Take 150 mg by mouth every 30 (thirty) days. Patient not taking: Reported on 01/28/2023 02/10/20   [provider]  metroNIDAZOLE (METROGEL) 1 % gel Apply 1 application topically daily.    [provider]    Family History    Family History  Problem Relation Age of Onset   Hypothyroidism Mother    Diverticulosis Mother    Hypertension Mother 38   Heart attack Mother 9   Hyperlipidemia Mother 79   Nephrolithiasis Father     Heart disease Father        CHF   Hypertension Father    Heart attack Father 55   Coronary artery disease Father 56       PCI   Kidney disease Father    Hyperlipidemia Father 67   Gallbladder disease Sister        #1   Hypertension Sister    Gallbladder disease Sister        #2   Hypertension Sister    Heart attack Maternal Grandmother 82   Diverticulosis Maternal Grandfather    Dementia Paternal Grandfather    Colon cancer Neg Hx    Colon polyps Neg Hx    Esophageal cancer Neg Hx    Rectal cancer Neg Hx    Stomach cancer Neg Hx    She indicated that her mother is alive. She indicated that her father is alive. She indicated that her maternal grandmother is deceased. She indicated that her maternal grandfather is deceased. She indicated that her paternal grandmother is deceased. She indicated that her paternal grandfather is deceased. She indicated that the status of her neg hx is unknown.  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  Tobacco Use   Smoking status: Never   Smokeless tobacco: Never  Vaping Use   Vaping status: Never Used  Substance and Sexual Activity   Alcohol use: Yes    Alcohol/week: 0.0 standard drinks of alcohol    Comment: occ   Drug use: No   Sexual activity: Not Currently  Other Topics Concern   Not on file  Social History Narrative   Not on file   Social Determinants of Health   Financial Resource Strain: Not on file  Food Insecurity: Not on file  Transportation Needs: Not on file  Physical Activity: Not on file  Stress: Not on file  Social Connections: Not on file  Intimate Partner Violence: Not on file     Review of Systems    General:  No chills, fever, night sweats or weight changes.  Cardiovascular:  No chest pain,  dyspnea on exertion, edema, orthopnea, palpitations, paroxysmal nocturnal dyspnea. Dermatological: No rash, lesions/masses Respiratory: No cough, dyspnea Urologic: No hematuria, dysuria Abdominal:   No nausea, vomiting, diarrhea, bright red blood per rectum, melena, or hematemesis Neurologic:  No visual changes, wkns, changes in mental status. All other systems reviewed and are otherwise negative except as noted above.  Physical Exam    VS:  BP 115/60 (BP Location: Left Arm, Patient Position: Sitting, Cuff Size: Normal)   Pulse 82   Ht 5\' 6"  (1.676 m)   Wt 152 lb (68.9  kg)   LMP 05/09/2018 (Approximate)   SpO2 95%   BMI 24.53 kg/m  , BMI Body mass index is 24.53 kg/m. GEN: Well nourished, well developed, in no acute distress. HEENT: normal. Neck: Supple, no JVD, carotid bruits, or masses. Cardiac: RRR, no murmurs, rubs, or gallops. No clubbing, cyanosis, edema.  Radials/DP/PT 2+ and equal bilaterally.  Respiratory:  Respirations regular and unlabored, clear to auscultation bilaterally. GI: Soft, nontender, nondistended, BS + x 4. MS: no deformity or atrophy. Skin: warm and dry, no rash. Neuro:  Strength and sensation are intact. Psych: Normal affect.  Accessory Clinical Findings    Recent Labs: 01/28/2023: ALT 19; BUN 8; Creatinine, Ser 0.83; Hemoglobin 13.3; Platelets 239.0; Potassium 3.8; Sodium 142; TSH 1.92   Recent Lipid Panel    Component Value Date/Time   CHOL 175 01/28/2023 1054   TRIG 96.0 01/28/2023 1054   HDL 49.90 01/28/2023 1054   CHOLHDL 4 01/28/2023 1054   VLDL 19.2 01/28/2023 1054   LDLCALC 106 (H) 01/28/2023 1054         ECG personally reviewed by me today- EKG Interpretation Date/Time:  Monday February 17 2023 15:19:32 EST Ventricular Rate:  82 PR Interval:  160 QRS Duration:  74 QT Interval:  364 QTC Calculation: 425 R Axis:   17  Text Interpretation: Normal sinus rhythm Normal ECG No previous ECGs available Confirmed by Edd Fabian  [id number]) on 02/17/2023 3:50:05 PM   Exercise treadmill test 11/07/2020  There was no ST segment deviation noted during stress. The patient walked for a total of 9 minutes on a standard Bruce protocol treadmill test. She achieved a peak heart rate of 173 which is 107% predicted maximal heart rate. At peak exercise there were no ST or T wave changes to suggest ischemia. There is no QRS widening at peak exercise. The blood pressure response to exercise is normal. This is interpreted as a negative stress test. There is no evidence of ischemia and no significant exercise-induced arrhythmias.   Coronary calcium scoring 02/12/2023  TECHNIQUE: A gated, non-contrast computed tomography scan of the heart was performed using 3mm slice thickness. Axial images were analyzed on a dedicated workstation. Calcium scoring of the coronary arteries was performed using the Agatston method.   FINDINGS: Coronary Calcium Score:   Left main: 0   Left anterior descending artery: 107   Left circumflex artery: 0   Right coronary artery: 0   Total: 107   Percentile: 95th   Pericardium: Normal.   Ascending Aorta: 37 mm at the mid ascending aorta measured in an axial plane.   IMPRESSION: 1. Coronary calcium score of 107. This was 95th percentile for age-, race-, and sex-matched controls.   2. Non-cardiac: See separate report from [facility].   RECOMMENDATIONS: Coronary artery calcium (CAC) score is a strong predictor of incident coronary heart disease (CHD) and provides predictive information beyond traditional risk factors. CAC scoring is reasonable to use in the decision to withhold, postpone, or initiate statin therapy in intermediate-risk or selected borderline-risk asymptomatic adults (age 18-75 years and LDL-C >=70 to <190 mg/dL) who do not have diabetes or established atherosclerotic cardiovascular disease (ASCVD).* In intermediate-risk (10-year ASCVD risk >=7.5% to <20%) adults  or selected borderline-risk (10-year ASCVD risk >=5% to <7.5%) adults in whom a CAC score is measured for the purpose of making a treatment decision the following recommendations have been made:   If CAC=0, it is reasonable to withhold statin therapy and reassess in 5 to 10 years, as  long as higher risk conditions are absent (diabetes mellitus, family history of premature CHD in first degree relatives (males <55 years; females <65 years), cigarette smoking, or LDL >=190 mg/dL).   If CAC is 1 to 99, it is reasonable to initiate statin therapy for patients >=40 years of age.   If CAC is >=100 or >=75th percentile, it is reasonable to initiate statin therapy at any age.   Cardiology referral should be considered for patients with CAC scores >=400 or >=75th percentile.   *2018 AHA/ACC/AACVPR/AAPA/ABC/ACPM/ADA/AGS/APhA/ASPC/NLA/PCNA Guideline on the Management of Blood Cholesterol: A Report of the American College of Cardiology/American Heart Association Task Force on Clinical Practice Guidelines. J Am Coll Cardiol. 2019;73(24):3168-3209.   Tessa Lerner, DO, [facility]     Electronically Signed   By: Tessa Lerner D.O.   On: 02/15/2023 19:08    Assessment & Plan   1.  Elevated coronary calcium scoring, hyperlipidemia-total coronary calcium score 107.  Left main 0, LAD 107, circumflex 0, RCA 0.  Family history of coronary disease.  Reassuring stress test 11/07/2020.  LDL 106 on 01/28/2023. Start atorvastatin 20 mg daily Heart healthy low-sodium high-fiber diet Increase physical activity as tolerated Plan for repeat fasting lipids and LFTs in 8 weeks CBC, BMP Coronary CTA  Dizziness, palpitations-has noted since June and reported that her symptoms started getting worse around August or September. CBC, BMP Cardiac event monitor Avoid triggers caffeine, chocolate, EtOH, dehydration etc. Change positions slowly May use lower extremity support stockings  Disposition: Return to clinic  in 8 weeks.   Thomasene Ripple. Bostyn Bogie NP-C     02/17/2023, 3:52 PM [facility] [address] Office [phone] Fax [phone]    I spent 14 minutes examining this patient, reviewing medications, and using patient centered shared decision making involving her cardiac care.   I spent greater than 20 minutes reviewing her past medical history,  medications, and prior cardiac tests.

## 2023-03-03 ENCOUNTER — Encounter (HOSPITAL_COMMUNITY): Payer: Self-pay

## 2023-03-05 ENCOUNTER — Ambulatory Visit (HOSPITAL_COMMUNITY)
Admission: RE | Admit: 2023-03-05 | Discharge: 2023-03-05 | Disposition: A | Discharge status: Home / Self Care | Payer: 59 | Source: Ambulatory Visit | Attending: General Practice | Admitting: General Practice

## 2023-03-05 DIAGNOSIS — R002 Palpitations: Secondary | ICD-10-CM

## 2023-03-05 DIAGNOSIS — I10 Essential (primary) hypertension: Secondary | ICD-10-CM | POA: Diagnosis not present

## 2023-03-05 DIAGNOSIS — R931 Abnormal findings on diagnostic imaging of heart and coronary circulation: Secondary | ICD-10-CM | POA: Diagnosis present

## 2023-03-05 DIAGNOSIS — R42 Dizziness and giddiness: Secondary | ICD-10-CM | POA: Diagnosis not present

## 2023-03-05 DIAGNOSIS — R072 Precordial pain: Secondary | ICD-10-CM | POA: Diagnosis not present

## 2023-03-05 MED ORDER — NITROGLYCERIN 0.4 MG SL SUBL
0.8000 mg | SUBLINGUAL_TABLET | Freq: Once | SUBLINGUAL | Status: AC
  Administered 2023-03-05: 0.8 mg via SUBLINGUAL

## 2023-03-05 MED ORDER — NITROGLYCERIN 0.4 MG SL SUBL
SUBLINGUAL_TABLET | SUBLINGUAL | Status: AC
  Filled 2023-03-05: qty 2

## 2023-03-05 MED ORDER — IOHEXOL 350 MG/ML SOLN
95.0000 mL | Freq: Once | INTRAVENOUS | Status: AC | PRN
  Administered 2023-03-05: 95 mL via INTRAVENOUS

## 2023-03-07 ENCOUNTER — Other Ambulatory Visit (HOSPITAL_COMMUNITY): Payer: Self-pay

## 2023-03-07 MED ORDER — ASPIRIN 81 MG PO TBEC: 81.0000 mg | DELAYED_RELEASE_TABLET | Freq: Every day | ORAL | Status: AC

## 2023-04-17 LAB — LIPID PANEL
Chol/HDL Ratio: 2.1 {ratio} (ref 0.0–4.4)
Cholesterol, Total: 125 mg/dL (ref 100–199)
HDL: 60 mg/dL (ref >39)
LDL Chol Calc (NIH): 54 mg/dL (ref 0–99)
Triglycerides: 45 mg/dL (ref 0–149)
VLDL Cholesterol Cal: 11 mg/dL (ref 5–40)

## 2023-04-17 LAB — HEPATIC FUNCTION PANEL
ALT: 19 [IU]/L (ref 0–32)
AST: 23 [IU]/L (ref 0–40)
Albumin: 4.4 g/dL (ref 3.8–4.9)
Alkaline Phosphatase: 122 [IU]/L — ABNORMAL HIGH (ref 44–121)
Bilirubin Total: 0.6 mg/dL (ref 0.0–1.2)
Bilirubin, Direct: 0.2 mg/dL (ref 0.00–0.40)
Total Protein: 7.1 g/dL (ref 6.0–8.5)

## 2023-04-18 NOTE — Progress Notes (Unsigned)
Cardiology Clinic Note   Patient Name: Sheila Carter Date of Encounter: 04/21/2023  Primary Care Provider:  Myrlene Broker, MD Primary Cardiologist:  Peter Swaziland, MD  Patient Profile    Sheila Carter 58 year old female presents to the clinic today for review of her coronary CTA and lipid panel  Past Medical History    Past Medical History:  Diagnosis Date   Abnormal pap    Anal fissure    Anemia    Blood transfusion without reported diagnosis    Gallbladder disease 2010   GERD (gastroesophageal reflux disease)    Melanoma (HCC) 2002   Thyroid nodule    Past Surgical History:  Procedure Laterality Date   ADENOIDECTOMY  1980   APPENDECTOMY  1984   GALLBLADDER SURGERY  2010   NOSE SURGERY  1992   UPPER GASTROINTESTINAL ENDOSCOPY  05/30/2020   Rob Bunting    Allergies  Allergies  Allergen Reactions   Sulfonamide Derivatives     REACTION: Rash fever   Lidocaine-Epinephrine (Pf) Palpitations    States feels like "my heart stops" , feels dizzy    History of Present Illness    Sheila Carter has a PMH of GERD, thyroid nodule, HLD, elevated coronary calcium scoring, tinnitus, arthralgia, sacral pain, and fatigue.  Her PMH also includes a family history of coronary disease.  She was seen initially by Dr. Herbie Baltimore 10/23/2020.  During that time she was referred by her PCP for evaluation of chest pressure that was radiating to her left shoulder.  She also had symptoms of belching.  Symptoms were not made worse with exertion.  They were worse at night and lasted most of the day.  She was started on Dexilant and referred to gastroenterology.  Exercise treadmill test was ordered and showed no ST segment deviation.  She was able to exercise for 9 minutes and achieved a peak heart rate of 173.  Stress test was negative.  She had a coronary calcium scoring test done by her PCP 02/14/2023 which showed a score of 107 placing her in the 95th  percentile for age race and sex matched controls.  Her left main 0, LAD, 107, circumflex 0, RCA 0.  She was referred back to cardiology for evaluation.  She contacted nurse triage line today and reported dizziness and increased heart rate for the past several months.  She presented to the clinic 02/17/23 for follow-up evaluation and stated she was hiking in June and noted with hiking she was getting more short of breath than she expected.  She reported that through the summer she did yard work and also noted increased work of breathing.  She had been walking her dog daily.  She denied a formal exercise routine.  We reviewed her coronary calcium scoring and options for next steps.  I reviewed the importance of high-fiber diet.  She expressed understanding.  I  ordered coronary CTA and atorvastatin 20 mg daily.  We planned repeat her fasting lipids and LFTs in 8 weeks and planned follow-up after testing.  She presents to the clinic today for follow-up evaluation and states has been trying to stop coffee due to reflux.  We reviewed her coronary CTA, lipid panel, and hepatic function panel.  She expressed understanding.  She did note some back discomfort which is not felt to be related to current medical therapy.  She has been somewhat physically active jumping on a trampoline 30 minutes 4 days/week.  We reviewed the importance of  high-fiber diet and I gave recommendations for helping reflux symptoms.  I will give her the high-fiber diet information, have her continue her current medical therapy, continue current physical activity, and plan follow-up in 12 months.  Today she denies lower extremity edema, fatigue, palpitations, melena, hematuria, hemoptysis, diaphoresis, weakness, presyncope, syncope, orthopnea, and PND.     Home Medications    Prior to Admission medications   Medication Sig Start Date End Date Taking? Authorizing Provider  clobetasol cream (TEMOVATE) 0.05 % Apply 1 application topically  as needed.    [provider]  famotidine (PEPCID) 20 MG tablet Take 1 tablet (20 mg total) by mouth 2 (two) times daily. 11/01/20   Rachael Fee, MD  ibandronate (BONIVA) 150 MG tablet Take 150 mg by mouth every 30 (thirty) days. Patient not taking: Reported on 01/28/2023 02/10/20   [provider]  metroNIDAZOLE (METROGEL) 1 % gel Apply 1 application topically daily.    [provider]    Family History    Family History  Problem Relation Age of Onset   Hypothyroidism Mother    Diverticulosis Mother    Hypertension Mother 51   Heart attack Mother 2   Hyperlipidemia Mother 9   Nephrolithiasis Father    Heart disease Father        CHF   Hypertension Father    Heart attack Father 71   Coronary artery disease Father 60       PCI   Kidney disease Father    Hyperlipidemia Father 55   Gallbladder disease Sister        #1   Hypertension Sister    Gallbladder disease Sister        #2   Hypertension Sister    Heart attack Maternal Grandmother 71   Diverticulosis Maternal Grandfather    Dementia Paternal Grandfather    Colon cancer Neg Hx    Colon polyps Neg Hx    Esophageal cancer Neg Hx    Rectal cancer Neg Hx    Stomach cancer Neg Hx    She indicated that her mother is alive. She indicated that her father is alive. She indicated that her maternal grandmother is deceased. She indicated that her maternal grandfather is deceased. She indicated that her paternal grandmother is deceased. She indicated that her paternal grandfather is deceased. She indicated that the status of her neg hx is unknown.  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  Tobacco Use   Smoking status: Never   Smokeless tobacco: Never  Vaping Use   Vaping status:  Never Used  Substance and Sexual Activity   Alcohol use: Yes    Alcohol/week: 0.0 standard drinks of alcohol    Comment: occ   Drug use: No   Sexual activity: Not Currently  Other Topics Concern   Not on file  Social History Narrative   Not on file   Social Drivers of Health   Financial Resource Strain: Not on file  Food Insecurity: Not on file  Transportation Needs: Not on file  Physical Activity: Not on file  Stress: Not on file  Social Connections: Not on file  Intimate Partner Violence: Not on file     Review of Systems  General:  No chills, fever, night sweats or weight changes.  Cardiovascular:  No chest pain, dyspnea on exertion, edema, orthopnea, palpitations, paroxysmal nocturnal dyspnea. Dermatological: No rash, lesions/masses Respiratory: No cough, dyspnea Urologic: No hematuria, dysuria Abdominal:   No nausea, vomiting, diarrhea, bright red blood per rectum, melena, or hematemesis Neurologic:  No visual changes, wkns, changes in mental status. All other systems reviewed and are otherwise negative except as noted above.  Physical Exam    VS:  BP 116/78 (BP Location: Left Arm, Patient Position: Sitting, Cuff Size: Normal)   Pulse 77   Ht 5' 6.5" (1.689 m)   Wt 159 lb 12.8 oz (72.5 kg)   LMP 05/09/2018 (Approximate)   SpO2 98%   BMI 25.41 kg/m  , BMI Body mass index is 25.41 kg/m. GEN: Well nourished, well developed, in no acute distress. HEENT: normal. Neck: Supple, no JVD, carotid bruits, or masses. Cardiac: RRR, no murmurs, rubs, or gallops. No clubbing, cyanosis, edema.  Radials/DP/PT 2+ and equal bilaterally.  Respiratory:  Respirations regular and unlabored, clear to auscultation bilaterally. GI: Soft, nontender, nondistended, BS + x 4. MS: no deformity or atrophy. Skin: warm and dry, no rash. Neuro:  Strength and sensation are intact. Psych: Normal affect.  Accessory Clinical Findings    Recent Labs: 01/28/2023: BUN 8; Creatinine, Ser 0.83;  Hemoglobin 13.3; Platelets 239.0; Potassium 3.8; Sodium 142; TSH 1.92 04/17/2023: ALT 19   Recent Lipid Panel    Component Value Date/Time   CHOL 125 04/17/2023 0825   TRIG 45 04/17/2023 0825   HDL 60 04/17/2023 0825   CHOLHDL 2.1 04/17/2023 0825   CHOLHDL 4 01/28/2023 1054   VLDL 19.2 01/28/2023 1054   LDLCALC 54 04/17/2023 0825         ECG personally reviewed by me today-   none today.  Exercise treadmill test 11/07/2020  There was no ST segment deviation noted during stress. The patient walked for a total of 9 minutes on a standard Bruce protocol treadmill test. She achieved a peak heart rate of 173 which is 107% predicted maximal heart rate. At peak exercise there were no ST or T wave changes to suggest ischemia. There is no QRS widening at peak exercise. The blood pressure response to exercise is normal. This is interpreted as a negative stress test. There is no evidence of ischemia and no significant exercise-induced arrhythmias.   Coronary calcium scoring 02/12/2023  TECHNIQUE: A gated, non-contrast computed tomography scan of the heart was performed using 3mm slice thickness. Axial images were analyzed on a dedicated workstation. Calcium scoring of the coronary arteries was performed using the Agatston method.   FINDINGS: Coronary Calcium Score:   Left main: 0   Left anterior descending artery: 107   Left circumflex artery: 0   Right coronary artery: 0   Total: 107   Percentile: 95th   Pericardium: Normal.   Ascending Aorta: 37 mm at the mid ascending aorta measured in an axial plane.   IMPRESSION: 1. Coronary calcium score of 107. This was 95th percentile for age-, race-, and sex-matched controls.   2. Non-cardiac: See separate report from [facility].   RECOMMENDATIONS: Coronary artery calcium (CAC) score is a strong predictor of incident coronary heart disease (CHD) and provides predictive information beyond traditional risk factors.  CAC scoring is reasonable to use in the decision to withhold, postpone, or initiate statin therapy in intermediate-risk or selected borderline-risk asymptomatic adults (age 45-75 years and LDL-C >=70 to <190 mg/dL) who do not  have diabetes or established atherosclerotic cardiovascular disease (ASCVD).* In intermediate-risk (10-year ASCVD risk >=7.5% to <20%) adults or selected borderline-risk (10-year ASCVD risk >=5% to <7.5%) adults in whom a CAC score is measured for the purpose of making a treatment decision the following recommendations have been made:   If CAC=0, it is reasonable to withhold statin therapy and reassess in 5 to 10 years, as long as higher risk conditions are absent (diabetes mellitus, family history of premature CHD in first degree relatives (males <55 years; females <65 years), cigarette smoking, or LDL >=190 mg/dL).   If CAC is 1 to 99, it is reasonable to initiate statin therapy for patients >=53 years of age.   If CAC is >=100 or >=75th percentile, it is reasonable to initiate statin therapy at any age.   Cardiology referral should be considered for patients with CAC scores >=400 or >=75th percentile.   *2018 AHA/ACC/AACVPR/AAPA/ABC/ACPM/ADA/AGS/APhA/ASPC/NLA/PCNA Guideline on the Management of Blood Cholesterol: A Report of the American College of Cardiology/American Heart Association Task Force on Clinical Practice Guidelines. J Am Coll Cardiol. 2019;73(24):3168-3209.   Tessa Lerner, DO, [facility]     Electronically Signed   By: Tessa Lerner D.O.   On: 02/15/2023 19:08  Coronary CTA 03/05/2023  FINDINGS: Image quality: Excellent.   Artifact: Limited.   Coronary artery calcification score:   Left main: 0   Left anterior descending artery: 44.2   Left circumflex artery: 1.07   Right coronary artery: 0   Total coronary calcium score of 45.3, places the patient at the 91st percentile for age and sex matched control.   Coronary arteries:  Normal coronary origins.  Right dominance.   Left Main Coronary Artery: Normal caliber vessel, originates from the left coronary cusp, bifurcates to form a left anterior descending artery (LAD) and a left circumflex artery (LCX). There is no plaque or stenosis.   Left Anterior Descending Artery: Normal caliber vessel, reaches the apex, gives off 2 diagonal branches. Minimal calcified plaque (0-24%) in the proximal LAD otherwise vessel is patent.   First Diagonal branch: Patent.   Second Diagonal branch: Patent.   Left Circumflex Artery: Normal caliber vessel, non-dominant, travels and tapers within the atrioventricular groove, gives off 3 obtuse marginal branches. The LCX is patent without stenosis.   First Obtuse Marginal branch: Patent   Second Obtuse Marginal branch: Large size, normal caliber, patent.   Right Coronary Artery: Dominant vessel, originates from the right coronary cusp, terminates as a PDA and right posterolateral branch. There is no evidence of plaque or stenosis.   Left Atrium: Grossly normal in size with no left atrial appendage filling defect.   Left Ventricle: Grossly normal in size. There is no abnormal filling defect.   Pulmonary arteries: Normal in size without proximal filling defect.   Pulmonary veins: Normal pulmonary venous drainage.   Aorta: Normal size, 35.7 mm at the mid ascending aorta (level of the PA bifurcation) measured double oblique. No calcifications. No dissection.   Pericardium: Normal thickness with no significant effusion or calcium present.   Aortic Valve:Trileaflet without significant calcification.   Mitral Valve: Normal structure without significant calcification.   Extra-cardiac findings: See attached radiology report for non-cardiac structures.   IMPRESSION: 1. Coronary calcium score of 45.3. This was 91st percentile for age-, sex, and race-matched controls.   2. Total plaque volume (TPV) 44 mm3 which is 60th  percentile for age- and sex-matched controls (calcified plaque 7 mm3; non-calcified plaque 37 mm3 (low attenuation plaque 1 mm3)). TPV is  mild.   3.  Normal coronary origin with right dominance.   4.  CAD-RADS = 1 minimal non-obstructive CAD.   Left Main: Patent.   LAD: Minimal calcified plaque (0-24%) in the proximal LAD otherwise vessel is patent.   LCx: Patent.   RCA: Patent.   RECOMMENDATIONS:   Consider non-atherosclerotic causes of chest pain. Consider preventive therapy and risk factor modification.   Electronically Signed: By: Tessa Lerner D.O. On: 03/06/2023 14:57  Assessment & Plan   1.Elevated coronary calcium scoring, hyperlipidemia-no chest pain today.  Denies exertional chest discomfort.  Previous total coronary calcium score 107.  Left main 0, LAD 107, circumflex 0, RCA 0.  Family history of coronary disease.  Reassuring stress test 11/07/2020.  LDL 106 on 01/28/2023. 01/28/2023: VLDL 19.2 04/17/2023: Cholesterol, Total 125; HDL 60; LDL Chol Calc (NIH) 54; Triglycerides 45.  Coronary CTA 03/05/2023 showed a coronary calcium score of 45.3.  She was noted to have minimal nonobstructive coronary disease with minimal calcified plaque 0-24% proximal LAD. Continue atorvastatin 20 mg daily Heart healthy low-sodium high-fiber diet-reviewed Maintain physical activity  Dizziness, palpitations-heart rate today 77 bpm.  Previously noted noted since June and reported that her symptoms started getting worse around August or September.  CBC 01/28/2023 and CMP reassuring. Avoid triggers caffeine, chocolate, EtOH, dehydration etc. Change positions slowly May use lower extremity support stockings-continue  Hyperlipidemia-LDL 54 on 04/17/2023 High-fiber diet Continue atorvastatin Plan for repeat fasting lipids and LFTs 1/26  GERD-stopped drinking coffee and is avoiding some other foods that contribute to reflux. GERD diet instructions  Disposition: Disposition: Follow-up with Dr.  Swaziland or me in12 months.   Thomasene Ripple. Bertie Simien NP-C     04/21/2023, 8:54 AM [facility] [address] Office [phone] Fax [phone]    I spent 14 minutes examining this patient, reviewing medications, and using patient centered shared decision making involving her cardiac care.   I spent greater than 20 minutes reviewing her past medical history,  medications, and prior cardiac tests.

## 2023-04-21 ENCOUNTER — Encounter: Payer: Self-pay | Admitting: General Practice

## 2023-04-21 ENCOUNTER — Ambulatory Visit: Payer: 59 | Attending: General Practice | Admitting: General Practice

## 2023-04-21 VITALS — BP 116/78 | HR 77 | Ht 66.5 in | Wt 159.8 lb

## 2023-04-21 DIAGNOSIS — E782 Mixed hyperlipidemia: Secondary | ICD-10-CM

## 2023-04-21 DIAGNOSIS — R002 Palpitations: Secondary | ICD-10-CM | POA: Diagnosis not present

## 2023-04-21 DIAGNOSIS — R931 Abnormal findings on diagnostic imaging of heart and coronary circulation: Secondary | ICD-10-CM | POA: Diagnosis not present

## 2023-04-21 DIAGNOSIS — R42 Dizziness and giddiness: Secondary | ICD-10-CM | POA: Diagnosis not present

## 2023-04-21 NOTE — Patient Instructions (Signed)
Medication Instructions:  The current medical regimen is effective;  continue present plan and medications as directed. Please refer to the Current Medication list given to you today.  *If you need a refill on your cardiac medications before your next appointment, please call your pharmacy*  Lab Work: NONE  Other Instructions PLEASE READ AND FOLLOW ATTACHED GERD AND INCREASED FIBER DIET   Follow-Up: At [facility], you and your health needs are our priority.  As part of our continuing mission to provide you with exceptional heart care, we have created designated Provider Care Teams.  These Care Teams include your primary Cardiologist (physician) and Advanced Practice Providers (APPs -  Physician Assistants and Nurse Practitioners) who all work together to provide you with the care you need, when you need it.  Your next appointment:   12 month(s)  Provider:   Peter [place], MD      High-Fiber Eating Plan Fiber, also called dietary fiber, is found in foods such as fruits, vegetables, whole grains, and beans. A high-fiber diet can be good for your health. Your health care provider may recommend a high-fiber diet to help: Prevent trouble pooping (constipation). Lower your cholesterol. Treat the following conditions: Hemorrhoids. This is inflammation of veins in the anus. Inflammation of specific areas of the digestive tract. Irritable bowel syndrome (IBS). This is a problem of the large intestine, also called the colon, that sometimes causes belly pain and bloating. Prevent overeating as part of a weight-loss plan. Lower the risk of heart disease, type 2 diabetes, and certain cancers. What are tips for following this plan? Reading food labels  Check the nutrition facts label on foods for the amount of dietary fiber. Choose foods that have 4 grams of fiber or more per serving. The recommended goals for how much fiber you should eat each day include: Males 44 years old or  younger: 30-34 g. Males over 11 years old: 28-34 g. Females 58 years old or younger: 25-28 g. Females over 34 years old: 22-25 g. Shopping Choose whole fruits and vegetables instead of processed. For example, choose apples instead of apple juice or applesauce. Choose a variety of high-fiber foods such as avocados, lentils, oats, and pinto beans. Read the nutrition facts label on foods. Check for foods with added fiber. These foods often have high sugar and salt (sodium) amounts per serving. Cooking Use whole-grain flour for baking and cooking. Cook with brown rice instead of white rice. Make meals that have a lot of beans and vegetables in them, such as chili or vegetable-based soups. Meal planning Start the day with a breakfast that is high in fiber, such as a cereal that has 5 g of fiber or more per serving. Eat breads and cereals that are made with whole-grain flour instead of refined flour or white flour. Eat brown rice, bulgur wheat, or millet instead of white rice. Use beans in place of meat in soups, salads, and pasta dishes. Be sure that half of the grains you eat each day are whole grains. General information You can get the recommended amount of dietary fiber by: Eating a variety of fruits, vegetables, grains, nuts, and beans. Taking a fiber supplement if you aren't able to eat enough fiber. It's better to get fiber through food than from a supplement. Slowly increase how much fiber you eat. If you increase the amount of fiber you eat too quickly, you may have bloating, cramping, or gas. Drink plenty of water to help you digest fiber. Choose  high-fiber snacks, such as berries, raw vegetables, nuts, and popcorn. What foods should I eat? Fruits Berries. Pears. Apples. Oranges. Avocado. Prunes and raisins. Dried figs. Vegetables Sweet potatoes. Spinach. Kale. Artichokes. Cabbage. Broccoli. Cauliflower. Green peas. Carrots. Squash. Grains Whole-grain breads. Multigrain cereal.  Oats and oatmeal. Brown rice. Barley. Bulgur wheat. Millet. Quinoa. Bran muffins. Popcorn. Rye wafer crackers. Meats and other proteins Navy beans, kidney beans, and pinto beans. Soybeans. Split peas. Lentils. Nuts and seeds. Dairy Fiber-fortified yogurt. Fortified means that fiber has been added to the product. Beverages Fiber-fortified soy milk. Fiber-fortified orange juice. Other foods Fiber bars. The items listed above may not be all the foods and drinks you can have. Talk to a dietitian to learn more. What foods should I avoid? Fruits Fruit juice. Cooked, strained fruit. Vegetables Fried potatoes. Canned vegetables. Well-cooked vegetables. Grains White bread. Pasta made with refined flour. White rice. Meats and other proteins Fatty meat. Fried chicken or fried fish. Dairy Milk. Cream cheese. Sour cream. Fats and oils Butters. Beverages Soft drinks. Other foods Cakes and pastries. The items listed above may not be all the foods and drinks you should avoid. Talk to a dietitian to learn more. This information is not intended to replace advice given to you by your health care provider. Make sure you discuss any questions you have with your health care provider.   Bland Diet (GERD DIET) A bland diet may consist of soft foods or foods that are not high in fat or are not greasy, acidic, or spicy. Avoiding certain foods may cause less irritation to your mouth, throat, stomach, or gastrointestinal tract. Avoiding certain foods may make you feel better. Everyone's tolerances are different. A bland diet should be based on what you can tolerate and what may cause discomfort. What is my plan? Your health care provider or dietitian may recommend specific changes to your diet to treat your symptoms. These changes may include: Eating small meals frequently. Cooking food until it is soft enough to chew easily. Taking the time to chew your food thoroughly, so it is easy to swallow and  digest. Avoiding foods that cause you discomfort. These may include spicy food, fried food, greasy foods, hard-to-chew foods, or citrus fruits and juices. Drinking slowly. What are tips for following this plan? Reading food labels To reduce fiber intake, look for food labels that say "whole," such as whole wheat or whole grain. Shopping Avoid food items that may have nuts or seeds. Avoid vegetables that may make you gassy or have a tough texture, such as broccoli, cauliflower, or corn. Cooking Cook foods thoroughly so they have a soft texture. Meal planning Make sure you include foods from all food groups to eat a balanced diet. Eat a variety of types of foods. Eat foods and drink beverages that do not cause you discomfort. These may include soups and broths with cooked meats, pasta, and vegetables. Lifestyle Sit up after meals, avoid tight clothing, and take time to eat and chew your food slowly. Ask your health care provider whether you should take dietary supplements. General information Mildly season your foods. Some seasonings, such as cayenne pepper, vinegar, or hot sauce, may cause irritation. The foods, beverages, or seasonings to avoid should be based on individual tolerance. What foods should I eat? Fruits Canned or cooked fruit such as peaches, pears, or applesauce. Bananas. Vegetables Well-cooked vegetables. Canned or cooked vegetables such as carrots, green beans, beets, or spinach. Mashed or boiled potatoes. Grains  Hot cereals, such as  cream of wheat and processed oatmeal. Rice. Bread, crackers, pasta, or tortillas made from refined white flour. Meats and other proteins  Eggs. Creamy peanut butter or other nut butters. Lean, well-cooked tender meats, such as beef, pork, chicken, or fish. Dairy Low-fat dairy products such as milk, cottage cheese, or yogurt. Beverages  Water. Herbal tea. Apple juice. Fats and oils Mild salad dressings. Canola or olive oil. Sweets  and desserts Low-fat pudding, custard, or ice cream. Fruit gelatin. The items listed above may not be a complete list of foods and beverages you can eat. Contact a dietitian for more information. What foods should I avoid? Fruits Citrus fruits, such as oranges and grapefruit. Fruits with a stringy texture. Fruits that have lots of seeds, such as kiwi or strawberries. Dried fruits. Vegetables Raw, uncooked vegetables. Salads. Grains Whole grain breads, muffins, and cereals. Meats and other proteins Tough, fibrous meats. Highly seasoned meat such as corned beef, smoked meats, or fish. Processed high-fat meats such as brats, hot dogs, or sausage. Dairy Full-fat dairy foods such as ice cream and cheese. Beverages Caffeinated drinks. Alcohol. Seasonings and condiments Strongly flavored seasonings or condiments. Hot sauce. Salsa. Other foods Spicy foods. Fried or greasy foods. Sour foods, such as pickled or fermented foods like sauerkraut. Foods high in fiber. The items listed above may not be a complete list of foods and beverages you should avoid. Contact a dietitian for more information. Summary A bland diet should be based on individual tolerance. It may consist of foods that are soft textured and do not have a lot of fat, fiber, acid, or seasonings. A bland diet may be recommended because avoiding certain foods, beverages, or spices may make you feel better. This information is not intended to replace advice given to you by your health care provider. Make sure you discuss any questions you have with your health care provider. Document Revised: 01/29/2021 Document Reviewed: 01/29/2021 [employer] Patient Education  2024 [employer].

## 2023-05-28 ENCOUNTER — Other Ambulatory Visit: Payer: Self-pay | Admitting: General Practice

## 2023-11-21 ENCOUNTER — Other Ambulatory Visit: Payer: Self-pay | Admitting: General Practice

## 2023-12-31 ENCOUNTER — Other Ambulatory Visit: Payer: Self-pay | Admitting: General Practice

## 2024-01-26 ENCOUNTER — Other Ambulatory Visit: Payer: Self-pay | Admitting: General Practice

## 2024-03-12 ENCOUNTER — Encounter: Payer: Self-pay | Admitting: Cardiology
